# Patient Record
Sex: Female | Born: 1945 | Race: White | Hispanic: No | Smoking: Former smoker
Health system: Southern US, Community
[De-identification: ages and names within clinical notes are randomized; demographics above are authoritative.]

## PROBLEM LIST (undated history)

## (undated) DIAGNOSIS — H269 Unspecified cataract: Secondary | ICD-10-CM

## (undated) DIAGNOSIS — E039 Hypothyroidism, unspecified: Secondary | ICD-10-CM

## (undated) HISTORY — PX: BACK SURGERY: SHX140

## (undated) HISTORY — DX: Unspecified cataract: H26.9

## (undated) HISTORY — PX: ABDOMINAL HYSTERECTOMY: SHX81

## (undated) HISTORY — PX: EYE SURGERY: SHX253

---

## 2000-12-05 ENCOUNTER — Other Ambulatory Visit: Admission: RE | Admit: 2000-12-05 | Discharge: 2000-12-05 | Payer: Self-pay | Admitting: Family Medicine

## 2003-01-14 ENCOUNTER — Other Ambulatory Visit: Admission: RE | Admit: 2003-01-14 | Discharge: 2003-01-14 | Payer: Self-pay | Admitting: Family Medicine

## 2004-01-15 ENCOUNTER — Ambulatory Visit: Payer: Self-pay | Admitting: Family Medicine

## 2004-01-15 ENCOUNTER — Other Ambulatory Visit: Admission: RE | Admit: 2004-01-15 | Discharge: 2004-01-15 | Payer: Self-pay | Admitting: Family Medicine

## 2004-03-04 ENCOUNTER — Ambulatory Visit: Payer: Self-pay | Admitting: Family Medicine

## 2006-01-16 ENCOUNTER — Encounter (INDEPENDENT_AMBULATORY_CARE_PROVIDER_SITE_OTHER): Payer: Self-pay | Admitting: Specialist

## 2006-01-16 ENCOUNTER — Ambulatory Visit: Payer: Self-pay | Admitting: Family Medicine

## 2006-01-16 ENCOUNTER — Other Ambulatory Visit: Admission: RE | Admit: 2006-01-16 | Discharge: 2006-01-16 | Payer: Self-pay | Admitting: Family Medicine

## 2006-01-16 LAB — CONVERTED CEMR LAB
ALT: 20 units/L (ref 0–40)
Albumin: 3.8 g/dL (ref 3.5–5.2)
Alkaline Phosphatase: 114 units/L (ref 39–117)
Basophils Absolute: 0.1 10*3/uL (ref 0.0–0.1)
CO2: 29 meq/L (ref 19–32)
Chol/HDL Ratio, serum: 6.5
GFR calc non Af Amer: 68 mL/min
Glomerular Filtration Rate, Af Am: 82 mL/min/{1.73_m2}
Glucose, Bld: 97 mg/dL (ref 70–99)
LDL DIRECT: 168.4 mg/dL
Monocytes Relative: 5.4 % (ref 3.0–11.0)
Platelets: 290 10*3/uL (ref 150–400)
Potassium: 4.1 meq/L (ref 3.5–5.1)
RBC: 4.4 M/uL (ref 3.87–5.11)
RDW: 13 % (ref 11.5–14.6)
TSH: 5.21 microintl units/mL (ref 0.35–5.50)
Total Bilirubin: 0.8 mg/dL (ref 0.3–1.2)
Total Protein: 6.8 g/dL (ref 6.0–8.3)
Triglyceride fasting, serum: 145 mg/dL (ref 0–149)

## 2006-04-27 ENCOUNTER — Encounter (INDEPENDENT_AMBULATORY_CARE_PROVIDER_SITE_OTHER): Payer: Self-pay | Admitting: *Deleted

## 2006-04-27 ENCOUNTER — Ambulatory Visit: Payer: Self-pay | Admitting: Family Medicine

## 2006-04-27 ENCOUNTER — Encounter: Payer: Self-pay | Admitting: Family Medicine

## 2006-04-27 ENCOUNTER — Other Ambulatory Visit: Admission: RE | Admit: 2006-04-27 | Discharge: 2006-04-27 | Payer: Self-pay | Admitting: Family Medicine

## 2006-05-08 ENCOUNTER — Ambulatory Visit: Payer: Self-pay | Admitting: Family Medicine

## 2006-05-08 LAB — CONVERTED CEMR LAB
Albumin: 3.7 g/dL (ref 3.5–5.2)
BUN: 6 mg/dL (ref 6–23)
CO2: 30 meq/L (ref 19–32)
Creatinine, Ser: 0.7 mg/dL (ref 0.4–1.2)
GFR calc non Af Amer: 91 mL/min
Total Bilirubin: 0.8 mg/dL (ref 0.3–1.2)

## 2006-05-18 ENCOUNTER — Ambulatory Visit: Payer: Self-pay | Admitting: Gastroenterology

## 2006-05-24 ENCOUNTER — Ambulatory Visit: Payer: Self-pay | Admitting: Family Medicine

## 2006-05-24 LAB — CONVERTED CEMR LAB
Albumin: 4.4 g/dL (ref 3.5–5.2)
HCV Ab: NEGATIVE
Hep A IgM: NEGATIVE
Indirect Bilirubin: 0.7 mg/dL (ref 0.0–0.9)
Total Protein: 7.4 g/dL (ref 6.0–8.3)

## 2006-05-29 DIAGNOSIS — E785 Hyperlipidemia, unspecified: Secondary | ICD-10-CM | POA: Insufficient documentation

## 2006-05-29 DIAGNOSIS — Z9079 Acquired absence of other genital organ(s): Secondary | ICD-10-CM | POA: Insufficient documentation

## 2006-07-14 ENCOUNTER — Ambulatory Visit: Payer: Self-pay | Admitting: Family Medicine

## 2006-07-14 DIAGNOSIS — M25559 Pain in unspecified hip: Secondary | ICD-10-CM | POA: Insufficient documentation

## 2007-01-13 ENCOUNTER — Encounter: Payer: Self-pay | Admitting: Family Medicine

## 2007-01-29 ENCOUNTER — Encounter (INDEPENDENT_AMBULATORY_CARE_PROVIDER_SITE_OTHER): Payer: Self-pay | Admitting: *Deleted

## 2007-03-01 ENCOUNTER — Ambulatory Visit: Payer: Self-pay | Admitting: Family Medicine

## 2007-03-01 DIAGNOSIS — J019 Acute sinusitis, unspecified: Secondary | ICD-10-CM | POA: Insufficient documentation

## 2007-06-29 ENCOUNTER — Ambulatory Visit: Payer: Self-pay | Admitting: Family Medicine

## 2007-07-09 ENCOUNTER — Telehealth (INDEPENDENT_AMBULATORY_CARE_PROVIDER_SITE_OTHER): Payer: Self-pay | Admitting: *Deleted

## 2007-07-17 ENCOUNTER — Telehealth (INDEPENDENT_AMBULATORY_CARE_PROVIDER_SITE_OTHER): Payer: Self-pay | Admitting: *Deleted

## 2007-12-28 ENCOUNTER — Ambulatory Visit: Payer: Self-pay | Admitting: Gastroenterology

## 2008-01-11 ENCOUNTER — Ambulatory Visit: Payer: Self-pay | Admitting: Gastroenterology

## 2008-01-16 ENCOUNTER — Other Ambulatory Visit: Admission: RE | Admit: 2008-01-16 | Discharge: 2008-01-16 | Payer: Self-pay | Admitting: Internal Medicine

## 2014-06-05 ENCOUNTER — Encounter: Payer: Self-pay | Admitting: Gastroenterology

## 2017-05-23 ENCOUNTER — Other Ambulatory Visit: Payer: Self-pay | Admitting: Orthopedic Surgery

## 2017-05-23 DIAGNOSIS — M545 Low back pain, unspecified: Secondary | ICD-10-CM

## 2017-06-12 ENCOUNTER — Ambulatory Visit
Admission: RE | Admit: 2017-06-12 | Discharge: 2017-06-12 | Disposition: A | Discharge status: Home / Self Care | Payer: Medicare Other | Source: Ambulatory Visit | Attending: Orthopedic Surgery | Admitting: Orthopedic Surgery

## 2017-06-12 ENCOUNTER — Other Ambulatory Visit: Payer: Self-pay

## 2017-06-12 DIAGNOSIS — M545 Low back pain, unspecified: Secondary | ICD-10-CM

## 2018-01-15 ENCOUNTER — Encounter: Payer: Self-pay | Admitting: Gastroenterology

## 2018-01-29 ENCOUNTER — Other Ambulatory Visit (HOSPITAL_COMMUNITY): Payer: Self-pay | Admitting: Neurological Surgery

## 2018-01-29 ENCOUNTER — Other Ambulatory Visit: Payer: Self-pay | Admitting: Neurological Surgery

## 2018-01-29 DIAGNOSIS — M4316 Spondylolisthesis, lumbar region: Secondary | ICD-10-CM

## 2018-02-07 HISTORY — PX: BACK SURGERY: SHX140

## 2018-02-13 MED ORDER — SODIUM CHLORIDE 0.9 % IV SOLN: 4.0000 mg | Freq: Four times a day (QID) | INTRAVENOUS | Status: AC | PRN

## 2018-02-15 ENCOUNTER — Ambulatory Visit (HOSPITAL_COMMUNITY)
Admission: RE | Admit: 2018-02-15 | Discharge: 2018-02-15 | Disposition: A | Discharge status: Home / Self Care | Payer: Medicare Other | Source: Ambulatory Visit | Attending: Neurological Surgery | Admitting: Neurological Surgery

## 2018-02-15 ENCOUNTER — Other Ambulatory Visit: Payer: Self-pay

## 2018-02-15 DIAGNOSIS — M48062 Spinal stenosis, lumbar region with neurogenic claudication: Secondary | ICD-10-CM | POA: Insufficient documentation

## 2018-02-15 DIAGNOSIS — M4316 Spondylolisthesis, lumbar region: Secondary | ICD-10-CM

## 2018-02-15 DIAGNOSIS — M5116 Intervertebral disc disorders with radiculopathy, lumbar region: Secondary | ICD-10-CM | POA: Insufficient documentation

## 2018-02-15 MED ORDER — IOPAMIDOL (ISOVUE-M 200) INJECTION 41%
20.0000 mL | Freq: Once | INTRAMUSCULAR | Status: AC
  Administered 2018-02-15: 12 mL via INTRATHECAL

## 2018-02-15 MED ORDER — DIAZEPAM 5 MG PO TABS
10.0000 mg | ORAL_TABLET | Freq: Once | ORAL | Status: AC
  Administered 2018-02-15: 10 mg via ORAL

## 2018-02-15 MED ORDER — HYDROCODONE-ACETAMINOPHEN 5-325 MG PO TABS
ORAL_TABLET | ORAL | Status: AC
  Filled 2018-02-15: qty 2

## 2018-02-15 MED ORDER — LIDOCAINE HCL (PF) 1 % IJ SOLN
5.0000 mL | Freq: Once | INTRAMUSCULAR | Status: AC
  Administered 2018-02-15: 5 mL via INTRADERMAL

## 2018-02-15 MED ORDER — HYDROCODONE-ACETAMINOPHEN 5-325 MG PO TABS
1.0000 | ORAL_TABLET | ORAL | Status: DC | PRN
  Administered 2018-02-15: 2 via ORAL

## 2018-02-15 MED ORDER — ONDANSETRON HCL 4 MG/2ML IJ SOLN: 4.0000 mg | Freq: Four times a day (QID) | INTRAMUSCULAR | Status: DC | PRN

## 2018-02-15 NOTE — Procedures (Signed)
Kylie Holmes is a 73 year old individual who has had significant issues with back pain over the past years time.  In May 2019 she underwent an MRI which demonstrated degenerative changes in the discs at L4-5 and L5-S1 with some mild to moderate stenosis.  Just before Christmas she developed significant pain in the back in both legs such that she could not walk more than 6 or 7 steps without experiencing severe and excruciating pain.  Because of the dramatic change I advised another imaging study this time in the form of a myelogram and post myelogram CAT scan.  Pre op Dx: Lumbar radiculopathy acute, severe Post op Dx: Lumbar radiculopathy, acute, severe Procedure: Lumbar myelogram Surgeon: Meleny Tregoning Puncture level: L2-3 Fluid color: Clear colorless Injection: Isovue-200, 12 mL Findings: Myelographic block at midportion of the body of L4 with some accumulation of dye at the L5-S1 space.  Consistent with severe central stenosis not previously noted.  Further evaluation with CT scan

## 2018-02-15 NOTE — Discharge Instructions (Signed)
Myelogram, Care After °Refer to this sheet in the next few weeks. These instructions provide you with information about caring for yourself after your procedure. Your health care provider may also give you more specific instructions. Your treatment has been planned according to current medical practices, but problems sometimes occur. Call your health care provider if you have any problems or questions after your procedure. °What can I expect after the procedure? °After the procedure, it is common to have: °· Soreness at your injection site. °· A mild headache. °Follow these instructions at home: °· Drink enough fluid to keep your urine clear or pale yellow. This will help flush out the dye (contrast material) from your spine. °· Rest as told by your health care provider. Lie flat with your head slightly raised (elevated) to reduce the risk of headache. °· Do not bend, lift, or do any strenuous activity for 24-48 hours or as told by your health care provider. °· Take over-the-counter and prescription medicines only as told by your health care provider. °· Take care of and remove your bandage (dressing) as told by your health care provider. °· Bathe or shower as told by your health care provider. °Contact a health care provider if: °· You have a fever. °· You have a headache that lasts longer than 24 hours. °· You feel nauseous or vomit. °· You have a stiff neck or numbness in your legs. °· You are unable to urinate or have a bowel movement. °· You develop a rash, itching, or sneezing. °Get help right away if: °· You have new symptoms or your symptoms get worse. °· You have a seizure. °· You have trouble breathing. °This information is not intended to replace advice given to you by your health care provider. Make sure you discuss any questions you have with your health care provider. °Document Released: 02/20/2015 Document Revised: 07/02/2015 Document Reviewed: 11/06/2014 °Elsevier Interactive Patient Education © 2019  [employer]. ° °

## 2018-02-16 ENCOUNTER — Encounter: Payer: Medicare Other | Admitting: Gastroenterology

## 2018-02-27 ENCOUNTER — Other Ambulatory Visit: Payer: Self-pay | Admitting: Neurological Surgery

## 2018-03-09 NOTE — Pre-Procedure Instructions (Signed)
Kylie Holmes Herald  03/09/2018      [facility] [place], [place] [address] Phone: [phone] Fax: [phone]    Your procedure is scheduled on March 19, 2018.  Report to [facility] Admitting at 1200 PM.  Call this number if you have problems the morning of surgery:  [phone]   Remember:  Do not eat or drink after midnight.    Take these medicines the morning of surgery with A SIP OF WATER  Levothyroxine (synthroid) Methocarbamol (robaxin)-if needed for muscle spasms  7 days prior to surgery STOP taking any Aspirin (unless otherwise instructed by your surgeon), Aleve, Naproxen, Ibuprofen, Motrin, Advil, Goody's, BC's, all herbal medications, fish oil, and all vitamins     Do not wear jewelry, make-up or nail polish.  Do not wear lotions, powders, or perfumes, or deodorant.  Do not shave 48 hours prior to surgery.   Do not bring valuables to the hospital.  [facility] is not responsible for any belongings or valuables.  Contacts, dentures or bridgework may not be worn into surgery.  Leave your suitcase in the car.  After surgery it may be brought to your room.  For patients admitted to the hospital, discharge time will be determined by your treatment team.  Patients discharged the day of surgery will not be allowed to drive home.    [facility]- Preparing For Surgery  Before surgery, you can play an important role. Because skin is not sterile, your skin needs to be as free of germs as possible. You can reduce the number of germs on your skin by washing with CHG (chlorahexidine gluconate) Soap before surgery.  CHG is an antiseptic cleaner which kills germs and bonds with the skin to continue killing germs even after washing.    Oral Hygiene is also important to reduce your risk of infection.  Remember - BRUSH YOUR TEETH THE MORNING OF SURGERY WITH YOUR REGULAR TOOTHPASTE  Please do not use  if you have an allergy to CHG or antibacterial soaps. If your skin becomes reddened/irritated stop using the CHG.  Do not shave (including legs and underarms) for at least 48 hours prior to first CHG shower. It is OK to shave your face.  Please follow these instructions carefully.   1. Shower the NIGHT BEFORE SURGERY and the MORNING OF SURGERY with CHG.   2. If you chose to wash your hair, wash your hair first as usual with your normal shampoo.  3. After you shampoo, rinse your hair and body thoroughly to remove the shampoo.  4. Use CHG as you would any other liquid soap. You can apply CHG directly to the skin and wash gently with a scrungie or a clean washcloth.   5. Apply the CHG Soap to your body ONLY FROM THE NECK DOWN.  Do not use on open wounds or open sores. Avoid contact with your eyes, ears, mouth and genitals (private parts). Wash Face and genitals (private parts)  with your normal soap.  6. Wash thoroughly, paying special attention to the area where your surgery will be performed.  7. Thoroughly rinse your body with warm water from the neck down.  8. DO NOT shower/wash with your normal soap after using and rinsing off the CHG Soap.  9. Pat yourself dry with a CLEAN TOWEL.  10. Wear CLEAN PAJAMAS to bed the night before surgery, wear comfortable clothes the morning  of surgery  11. Place CLEAN SHEETS on your bed the night of your first shower and DO NOT SLEEP WITH PETS.  Day of Surgery:  Do not apply any deodorants/lotions.  Please wear clean clothes to the hospital/surgery center.   Remember to brush your teeth WITH YOUR REGULAR TOOTHPASTE.  Please read over the following fact sheets that you were given.

## 2018-03-09 NOTE — Progress Notes (Addendum)
PCP - Wenda Low, MD Cardiologist - n/a  Chest x-ray - pt denies past year EKG - pt denies past year  Stress Test - pt denies ECHO - pt denies   Cardiac Cath - pt denies  Sleep Study - pt denies CPAP - n/a  Fasting Blood Sugar - n/a Checks Blood Sugar _____ times a day-n/a  Blood Thinner Instructions: n/a Aspirin Instructions: n/a   Anesthesia review: pending  Patient denies shortness of breath, fever, cough and chest pain at PAT appointment  Patient verbalized understanding of instructions that were given to them at the PAT appointment. Patient was also instructed that they will need to review over the PAT instructions again at home before surgery.

## 2018-03-12 ENCOUNTER — Encounter (HOSPITAL_COMMUNITY): Payer: Self-pay

## 2018-03-12 ENCOUNTER — Other Ambulatory Visit: Payer: Self-pay

## 2018-03-12 ENCOUNTER — Encounter (HOSPITAL_COMMUNITY)
Admission: RE | Admit: 2018-03-12 | Discharge: 2018-03-12 | Disposition: A | Discharge status: Home / Self Care | Payer: Medicare Other | Source: Ambulatory Visit | Attending: Neurological Surgery | Admitting: Neurological Surgery

## 2018-03-12 DIAGNOSIS — M4316 Spondylolisthesis, lumbar region: Secondary | ICD-10-CM | POA: Diagnosis not present

## 2018-03-12 DIAGNOSIS — Z01818 Encounter for other preprocedural examination: Secondary | ICD-10-CM | POA: Insufficient documentation

## 2018-03-12 HISTORY — DX: Hypothyroidism, unspecified: E03.9

## 2018-03-12 LAB — CBC
HCT: 39.3 % (ref 36.0–46.0)
HEMOGLOBIN: 13.5 g/dL (ref 12.0–15.0)
MCH: 33.8 pg (ref 26.0–34.0)
MCHC: 34.4 g/dL (ref 30.0–36.0)
MCV: 98.3 fL (ref 80.0–100.0)
Platelets: 219 10*3/uL (ref 150–400)
RBC: 4 MIL/uL (ref 3.87–5.11)
RDW: 13 % (ref 11.5–15.5)
WBC: 7.1 10*3/uL (ref 4.0–10.5)
nRBC: 0 % (ref 0.0–0.2)

## 2018-03-12 LAB — TYPE AND SCREEN
ABO/RH(D): O POS
Antibody Screen: NEGATIVE

## 2018-03-12 LAB — BASIC METABOLIC PANEL
Anion gap: 9 (ref 5–15)
BUN: 11 mg/dL (ref 8–23)
CHLORIDE: 106 mmol/L (ref 98–111)
CO2: 26 mmol/L (ref 22–32)
CREATININE: 0.79 mg/dL (ref 0.44–1.00)
Calcium: 8.7 mg/dL — ABNORMAL LOW (ref 8.9–10.3)
GFR calc Af Amer: >60 mL/min (ref >60)
GFR calc non Af Amer: >60 mL/min (ref >60)
Glucose, Bld: 99 mg/dL (ref 70–99)
Potassium: 3.8 mmol/L (ref 3.5–5.1)
SODIUM: 141 mmol/L (ref 135–145)

## 2018-03-12 LAB — ABO/RH: ABO/RH(D): O POS

## 2018-03-12 LAB — SURGICAL PCR SCREEN
MRSA, PCR: NEGATIVE
Staphylococcus aureus: NEGATIVE

## 2018-03-19 ENCOUNTER — Other Ambulatory Visit: Payer: Self-pay

## 2018-03-19 ENCOUNTER — Inpatient Hospital Stay (HOSPITAL_COMMUNITY)
Admission: RE | Admit: 2018-03-19 | Discharge: 2018-03-21 | DRG: 455 | Disposition: A | Discharge status: Home Health | Payer: Medicare Other | Attending: Neurological Surgery | Admitting: Neurological Surgery

## 2018-03-19 ENCOUNTER — Inpatient Hospital Stay (HOSPITAL_COMMUNITY): Payer: Medicare Other

## 2018-03-19 ENCOUNTER — Inpatient Hospital Stay (HOSPITAL_COMMUNITY)
Admission: RE | Disposition: A | Discharge status: Home Health | Payer: Self-pay | Source: Home / Self Care | Attending: Neurological Surgery

## 2018-03-19 ENCOUNTER — Inpatient Hospital Stay (HOSPITAL_COMMUNITY): Payer: Medicare Other | Admitting: Certified Registered Nurse Anesthetist

## 2018-03-19 ENCOUNTER — Encounter (HOSPITAL_COMMUNITY): Payer: Self-pay

## 2018-03-19 DIAGNOSIS — I9581 Postprocedural hypotension: Secondary | ICD-10-CM | POA: Diagnosis not present

## 2018-03-19 DIAGNOSIS — Z87891 Personal history of nicotine dependence: Secondary | ICD-10-CM

## 2018-03-19 DIAGNOSIS — G992 Myelopathy in diseases classified elsewhere: Secondary | ICD-10-CM | POA: Diagnosis present

## 2018-03-19 DIAGNOSIS — M48062 Spinal stenosis, lumbar region with neurogenic claudication: Principal | ICD-10-CM | POA: Diagnosis present

## 2018-03-19 DIAGNOSIS — E039 Hypothyroidism, unspecified: Secondary | ICD-10-CM | POA: Diagnosis present

## 2018-03-19 DIAGNOSIS — M4316 Spondylolisthesis, lumbar region: Secondary | ICD-10-CM | POA: Diagnosis present

## 2018-03-19 DIAGNOSIS — Z419 Encounter for procedure for purposes other than remedying health state, unspecified: Secondary | ICD-10-CM

## 2018-03-19 DIAGNOSIS — Z79899 Other long term (current) drug therapy: Secondary | ICD-10-CM

## 2018-03-19 DIAGNOSIS — M5116 Intervertebral disc disorders with radiculopathy, lumbar region: Secondary | ICD-10-CM | POA: Diagnosis present

## 2018-03-19 DIAGNOSIS — Z79890 Hormone replacement therapy (postmenopausal): Secondary | ICD-10-CM

## 2018-03-19 DIAGNOSIS — Z90710 Acquired absence of both cervix and uterus: Secondary | ICD-10-CM

## 2018-03-19 LAB — HEMOGLOBIN AND HEMATOCRIT, BLOOD
HCT: 31.8 % — ABNORMAL LOW (ref 36.0–46.0)
Hemoglobin: 10.6 g/dL — ABNORMAL LOW (ref 12.0–15.0)

## 2018-03-19 SURGERY — POSTERIOR LUMBAR FUSION 1 LEVEL
Anesthesia: General | Site: Spine Lumbar

## 2018-03-19 MED ORDER — OXYCODONE-ACETAMINOPHEN 5-325 MG PO TABS
1.0000 | ORAL_TABLET | ORAL | Status: DC | PRN
  Filled 2018-03-19: qty 2

## 2018-03-19 MED ORDER — LACTATED RINGERS IV SOLN
INTRAVENOUS | Status: DC
  Administered 2018-03-19: 20:00:00 via INTRAVENOUS

## 2018-03-19 MED ORDER — LACTATED RINGERS IV SOLN
INTRAVENOUS | Status: DC
  Administered 2018-03-19 (×3): via INTRAVENOUS

## 2018-03-19 MED ORDER — ONDANSETRON HCL 4 MG PO TABS: 4.0000 mg | ORAL_TABLET | Freq: Four times a day (QID) | ORAL | Status: DC | PRN

## 2018-03-19 MED ORDER — ONDANSETRON HCL 4 MG/2ML IJ SOLN
INTRAMUSCULAR | Status: AC
  Filled 2018-03-19: qty 2

## 2018-03-19 MED ORDER — SODIUM CHLORIDE 0.9% FLUSH
3.0000 mL | Freq: Two times a day (BID) | INTRAVENOUS | Status: DC
  Administered 2018-03-20 (×2): 3 mL via INTRAVENOUS

## 2018-03-19 MED ORDER — ACETAMINOPHEN 325 MG PO TABS
650.0000 mg | ORAL_TABLET | ORAL | Status: DC | PRN
  Administered 2018-03-20 – 2018-03-21 (×3): 650 mg via ORAL
  Filled 2018-03-19 (×3): qty 2

## 2018-03-19 MED ORDER — METHOCARBAMOL 500 MG PO TABS
500.0000 mg | ORAL_TABLET | Freq: Four times a day (QID) | ORAL | Status: DC | PRN
  Administered 2018-03-20 (×2): 500 mg via ORAL
  Filled 2018-03-19 (×2): qty 1

## 2018-03-19 MED ORDER — THROMBIN 20000 UNITS EX SOLR
CUTANEOUS | Status: DC | PRN
  Administered 2018-03-19: 20 mL via TOPICAL

## 2018-03-19 MED ORDER — ALUM & MAG HYDROXIDE-SIMETH 200-200-20 MG/5ML PO SUSP: 30.0000 mL | Freq: Four times a day (QID) | ORAL | Status: DC | PRN

## 2018-03-19 MED ORDER — CEFAZOLIN SODIUM-DEXTROSE 2-4 GM/100ML-% IV SOLN
2.0000 g | INTRAVENOUS | Status: AC
  Administered 2018-03-19 (×2): 2 g via INTRAVENOUS
  Filled 2018-03-19: qty 100

## 2018-03-19 MED ORDER — BUPIVACAINE HCL (PF) 0.5 % IJ SOLN
INTRAMUSCULAR | Status: DC | PRN
  Administered 2018-03-19: 5 mL

## 2018-03-19 MED ORDER — ONDANSETRON HCL 4 MG/2ML IJ SOLN
4.0000 mg | Freq: Once | INTRAMUSCULAR | Status: AC | PRN
  Administered 2018-03-19: 4 mg via INTRAVENOUS

## 2018-03-19 MED ORDER — FENTANYL CITRATE (PF) 100 MCG/2ML IJ SOLN
INTRAMUSCULAR | Status: AC
  Administered 2018-03-19: 50 ug via INTRAVENOUS
  Filled 2018-03-19: qty 2

## 2018-03-19 MED ORDER — ONDANSETRON HCL 4 MG/2ML IJ SOLN: 4.0000 mg | Freq: Four times a day (QID) | INTRAMUSCULAR | Status: DC | PRN

## 2018-03-19 MED ORDER — LIDOCAINE 2% (20 MG/ML) 5 ML SYRINGE
INTRAMUSCULAR | Status: DC | PRN
  Administered 2018-03-19 (×2): 50 mg via INTRAVENOUS

## 2018-03-19 MED ORDER — PROPOFOL 10 MG/ML IV BOLUS
INTRAVENOUS | Status: DC | PRN
  Administered 2018-03-19 (×2): 20 mg via INTRAVENOUS
  Administered 2018-03-19: 160 mg via INTRAVENOUS

## 2018-03-19 MED ORDER — OXYCODONE HCL 5 MG PO TABS: 5.0000 mg | ORAL_TABLET | Freq: Once | ORAL | Status: DC | PRN

## 2018-03-19 MED ORDER — OXYCODONE HCL 5 MG/5ML PO SOLN: 5.0000 mg | Freq: Once | ORAL | Status: DC | PRN

## 2018-03-19 MED ORDER — LIDOCAINE-EPINEPHRINE 1 %-1:100000 IJ SOLN
INTRAMUSCULAR | Status: DC | PRN
  Administered 2018-03-19: 5 mL

## 2018-03-19 MED ORDER — METHOCARBAMOL 500 MG PO TABS: 500.0000 mg | ORAL_TABLET | Freq: Three times a day (TID) | ORAL | Status: DC | PRN

## 2018-03-19 MED ORDER — POLYETHYLENE GLYCOL 3350 17 G PO PACK: 17.0000 g | PACK | Freq: Every day | ORAL | Status: DC | PRN

## 2018-03-19 MED ORDER — LIDOCAINE-EPINEPHRINE 1 %-1:100000 IJ SOLN
INTRAMUSCULAR | Status: AC
  Filled 2018-03-19: qty 1

## 2018-03-19 MED ORDER — CHLORHEXIDINE GLUCONATE CLOTH 2 % EX PADS: 6.0000 | MEDICATED_PAD | Freq: Once | CUTANEOUS | Status: DC

## 2018-03-19 MED ORDER — DEXAMETHASONE SODIUM PHOSPHATE 10 MG/ML IJ SOLN
INTRAMUSCULAR | Status: AC
  Filled 2018-03-19: qty 1

## 2018-03-19 MED ORDER — PHENOL 1.4 % MT LIQD: 1.0000 | OROMUCOSAL | Status: DC | PRN

## 2018-03-19 MED ORDER — MORPHINE SULFATE (PF) 2 MG/ML IV SOLN
2.0000 mg | INTRAVENOUS | Status: DC | PRN
  Filled 2018-03-19: qty 1

## 2018-03-19 MED ORDER — SENNA 8.6 MG PO TABS
1.0000 | ORAL_TABLET | Freq: Two times a day (BID) | ORAL | Status: DC
  Administered 2018-03-20: 8.6 mg via ORAL
  Filled 2018-03-19 (×2): qty 1

## 2018-03-19 MED ORDER — FENTANYL CITRATE (PF) 100 MCG/2ML IJ SOLN
INTRAMUSCULAR | Status: DC | PRN
  Administered 2018-03-19: 50 ug via INTRAVENOUS
  Administered 2018-03-19: 25 ug via INTRAVENOUS
  Administered 2018-03-19: 50 ug via INTRAVENOUS

## 2018-03-19 MED ORDER — ALBUMIN HUMAN 5 % IV SOLN
INTRAVENOUS | Status: AC
  Filled 2018-03-19: qty 500

## 2018-03-19 MED ORDER — SODIUM CHLORIDE 0.9 % IV SOLN: 250.0000 mL | INTRAVENOUS | Status: DC

## 2018-03-19 MED ORDER — LIDOCAINE 2% (20 MG/ML) 5 ML SYRINGE
INTRAMUSCULAR | Status: AC
  Filled 2018-03-19: qty 5

## 2018-03-19 MED ORDER — THROMBIN 5000 UNITS EX SOLR
CUTANEOUS | Status: AC
  Filled 2018-03-19: qty 5000

## 2018-03-19 MED ORDER — FENTANYL CITRATE (PF) 250 MCG/5ML IJ SOLN
INTRAMUSCULAR | Status: AC
  Filled 2018-03-19: qty 5

## 2018-03-19 MED ORDER — DOCUSATE SODIUM 100 MG PO CAPS
100.0000 mg | ORAL_CAPSULE | Freq: Two times a day (BID) | ORAL | Status: DC
  Administered 2018-03-20 (×2): 100 mg via ORAL
  Filled 2018-03-19 (×2): qty 1

## 2018-03-19 MED ORDER — THROMBIN 20000 UNITS EX SOLR
CUTANEOUS | Status: AC
  Filled 2018-03-19: qty 20000

## 2018-03-19 MED ORDER — GLYCOPYRROLATE PF 0.2 MG/ML IJ SOSY
PREFILLED_SYRINGE | INTRAMUSCULAR | Status: DC | PRN
  Administered 2018-03-19: .2 mg via INTRAVENOUS

## 2018-03-19 MED ORDER — BUPIVACAINE HCL (PF) 0.5 % IJ SOLN
INTRAMUSCULAR | Status: AC
  Filled 2018-03-19: qty 30

## 2018-03-19 MED ORDER — SODIUM CHLORIDE 0.9% FLUSH: 3.0000 mL | INTRAVENOUS | Status: DC | PRN

## 2018-03-19 MED ORDER — METHOCARBAMOL 1000 MG/10ML IJ SOLN
500.0000 mg | Freq: Four times a day (QID) | INTRAVENOUS | Status: DC | PRN
  Administered 2018-03-19 – 2018-03-20 (×2): 500 mg via INTRAVENOUS
  Filled 2018-03-19: qty 500
  Filled 2018-03-19: qty 5
  Filled 2018-03-19: qty 500

## 2018-03-19 MED ORDER — FENTANYL CITRATE (PF) 100 MCG/2ML IJ SOLN
25.0000 ug | INTRAMUSCULAR | Status: DC | PRN
  Administered 2018-03-19 (×2): 25 ug via INTRAVENOUS
  Administered 2018-03-19 (×2): 50 ug via INTRAVENOUS

## 2018-03-19 MED ORDER — BISACODYL 10 MG RE SUPP: 10.0000 mg | Freq: Every day | RECTAL | Status: DC | PRN

## 2018-03-19 MED ORDER — ROCURONIUM BROMIDE 50 MG/5ML IV SOSY
PREFILLED_SYRINGE | INTRAVENOUS | Status: AC
  Filled 2018-03-19: qty 5

## 2018-03-19 MED ORDER — ONDANSETRON HCL 4 MG/2ML IJ SOLN
INTRAMUSCULAR | Status: DC | PRN
  Administered 2018-03-19: 4 mg via INTRAVENOUS

## 2018-03-19 MED ORDER — ACETAMINOPHEN 650 MG RE SUPP: 650.0000 mg | RECTAL | Status: DC | PRN

## 2018-03-19 MED ORDER — PROPOFOL 10 MG/ML IV BOLUS
INTRAVENOUS | Status: AC
  Filled 2018-03-19: qty 20

## 2018-03-19 MED ORDER — KETOROLAC TROMETHAMINE 15 MG/ML IJ SOLN
7.5000 mg | Freq: Four times a day (QID) | INTRAMUSCULAR | Status: AC
  Administered 2018-03-19 – 2018-03-20 (×4): 7.5 mg via INTRAVENOUS
  Filled 2018-03-19 (×5): qty 1

## 2018-03-19 MED ORDER — FENTANYL CITRATE (PF) 100 MCG/2ML IJ SOLN
INTRAMUSCULAR | Status: AC
  Administered 2018-03-19: 25 ug via INTRAVENOUS
  Filled 2018-03-19: qty 2

## 2018-03-19 MED ORDER — DEXAMETHASONE SODIUM PHOSPHATE 10 MG/ML IJ SOLN
INTRAMUSCULAR | Status: DC | PRN
  Administered 2018-03-19: 10 mg via INTRAVENOUS

## 2018-03-19 MED ORDER — SODIUM CHLORIDE 0.9 % IV SOLN
INTRAVENOUS | Status: DC | PRN
  Administered 2018-03-19: 500 mL

## 2018-03-19 MED ORDER — FLEET ENEMA 7-19 GM/118ML RE ENEM: 1.0000 | ENEMA | Freq: Once | RECTAL | Status: DC | PRN

## 2018-03-19 MED ORDER — CEFAZOLIN SODIUM-DEXTROSE 2-4 GM/100ML-% IV SOLN
2.0000 g | Freq: Three times a day (TID) | INTRAVENOUS | Status: AC
  Administered 2018-03-19 – 2018-03-20 (×2): 2 g via INTRAVENOUS
  Filled 2018-03-19 (×2): qty 100

## 2018-03-19 MED ORDER — 0.9 % SODIUM CHLORIDE (POUR BTL) OPTIME
TOPICAL | Status: DC | PRN
  Administered 2018-03-19: 1000 mL

## 2018-03-19 MED ORDER — SODIUM CHLORIDE 0.9 % IV SOLN
INTRAVENOUS | Status: DC | PRN
  Administered 2018-03-19: 20 ug/min via INTRAVENOUS

## 2018-03-19 MED ORDER — THROMBIN 5000 UNITS EX SOLR
OROMUCOSAL | Status: DC | PRN
  Administered 2018-03-19 (×2): 5 mL via TOPICAL

## 2018-03-19 MED ORDER — LEVOTHYROXINE SODIUM 75 MCG PO TABS
75.0000 ug | ORAL_TABLET | Freq: Every day | ORAL | Status: DC
  Administered 2018-03-20 – 2018-03-21 (×2): 75 ug via ORAL
  Filled 2018-03-19 (×2): qty 1

## 2018-03-19 MED ORDER — DEXMEDETOMIDINE HCL 200 MCG/2ML IV SOLN
INTRAVENOUS | Status: DC | PRN
  Administered 2018-03-19 (×2): 8 ug via INTRAVENOUS
  Administered 2018-03-19: 12 ug via INTRAVENOUS

## 2018-03-19 MED ORDER — ALBUMIN HUMAN 5 % IV SOLN
25.0000 g | Freq: Once | INTRAVENOUS | Status: AC
  Administered 2018-03-19: 25 g via INTRAVENOUS

## 2018-03-19 MED ORDER — ROCURONIUM BROMIDE 10 MG/ML (PF) SYRINGE
PREFILLED_SYRINGE | INTRAVENOUS | Status: DC | PRN
  Administered 2018-03-19: 20 mg via INTRAVENOUS
  Administered 2018-03-19: 30 mg via INTRAVENOUS
  Administered 2018-03-19: 50 mg via INTRAVENOUS
  Administered 2018-03-19 (×3): 10 mg via INTRAVENOUS

## 2018-03-19 MED ORDER — MENTHOL 3 MG MT LOZG: 1.0000 | LOZENGE | OROMUCOSAL | Status: DC | PRN

## 2018-03-19 SURGICAL SUPPLY — 72 items
BAG DECANTER FOR FLEXI CONT (MISCELLANEOUS) ×2 IMPLANT
BASKET BONE COLLECTION (BASKET) ×2 IMPLANT
BLADE CLIPPER SURG (BLADE) IMPLANT
BONE CANC CHIPS 20CC PCAN1/4 (Bone Implant) ×2 IMPLANT
BUR MATCHSTICK NEURO 3.0 LAGG (BURR) ×2 IMPLANT
CAGE COROENT LG 10X9X23-12 (Cage) ×4 IMPLANT
CANISTER SUCT 3000ML PPV (MISCELLANEOUS) ×2 IMPLANT
CHIPS CANC BONE 20CC PCAN1/4 (Bone Implant) ×1 IMPLANT
CONT SPEC 4OZ CLIKSEAL STRL BL (MISCELLANEOUS) ×2 IMPLANT
COVER BACK TABLE 60X90IN (DRAPES) ×2 IMPLANT
COVER WAND RF STERILE (DRAPES) IMPLANT
DECANTER SPIKE VIAL GLASS SM (MISCELLANEOUS) ×2 IMPLANT
DERMABOND ADVANCED (GAUZE/BANDAGES/DRESSINGS) ×1
DERMABOND ADVANCED .7 DNX12 (GAUZE/BANDAGES/DRESSINGS) ×1 IMPLANT
DEVICE DISSECT PLASMABLAD 3.0S (MISCELLANEOUS) ×1 IMPLANT
DRAPE C-ARM 42X72 X-RAY (DRAPES) ×4 IMPLANT
DRAPE HALF SHEET 40X57 (DRAPES) IMPLANT
DRAPE LAPAROTOMY 100X72X124 (DRAPES) ×2 IMPLANT
DRAPE MICROSCOPE LEICA (MISCELLANEOUS) ×2 IMPLANT
DRSG OPSITE POSTOP 4X6 (GAUZE/BANDAGES/DRESSINGS) IMPLANT
DURAPREP 26ML APPLICATOR (WOUND CARE) ×2 IMPLANT
DURASEAL APPLICATOR TIP (TIP) IMPLANT
DURASEAL SPINE SEALANT 3ML (MISCELLANEOUS) IMPLANT
ELECT REM PT RETURN 9FT ADLT (ELECTROSURGICAL) ×2
ELECTRODE REM PT RTRN 9FT ADLT (ELECTROSURGICAL) ×1 IMPLANT
GAUZE 4X4 16PLY RFD (DISPOSABLE) IMPLANT
GAUZE SPONGE 4X4 12PLY STRL (GAUZE/BANDAGES/DRESSINGS) IMPLANT
GLOVE BIO SURGEON STRL SZ7 (GLOVE) ×2 IMPLANT
GLOVE BIO SURGEON STRL SZ7.5 (GLOVE) ×2 IMPLANT
GLOVE BIOGEL PI IND STRL 6.5 (GLOVE) ×1 IMPLANT
GLOVE BIOGEL PI IND STRL 7.5 (GLOVE) ×1 IMPLANT
GLOVE BIOGEL PI IND STRL 8.5 (GLOVE) ×2 IMPLANT
GLOVE BIOGEL PI INDICATOR 6.5 (GLOVE) ×1
GLOVE BIOGEL PI INDICATOR 7.5 (GLOVE) ×1
GLOVE BIOGEL PI INDICATOR 8.5 (GLOVE) ×2
GLOVE ECLIPSE 8.5 STRL (GLOVE) ×4 IMPLANT
GLOVE SURG SS PI 6.0 STRL IVOR (GLOVE) ×4 IMPLANT
GLOVE SURG SS PI 7.5 STRL IVOR (GLOVE) ×14 IMPLANT
GOWN STRL REUS W/ TWL LRG LVL3 (GOWN DISPOSABLE) ×4 IMPLANT
GOWN STRL REUS W/ TWL XL LVL3 (GOWN DISPOSABLE) IMPLANT
GOWN STRL REUS W/TWL 2XL LVL3 (GOWN DISPOSABLE) ×4 IMPLANT
GOWN STRL REUS W/TWL LRG LVL3 (GOWN DISPOSABLE) ×4
GOWN STRL REUS W/TWL XL LVL3 (GOWN DISPOSABLE)
HEMOSTAT POWDER KIT SURGIFOAM (HEMOSTASIS) ×2 IMPLANT
KIT BASIN OR (CUSTOM PROCEDURE TRAY) ×2 IMPLANT
KIT INFUSE SMALL (Orthopedic Implant) ×2 IMPLANT
KIT TURNOVER KIT B (KITS) ×2 IMPLANT
MILL MEDIUM DISP (BLADE) ×2 IMPLANT
NEEDLE HYPO 22GX1.5 SAFETY (NEEDLE) ×2 IMPLANT
NEEDLE SPNL 20GX3.5 QUINCKE YW (NEEDLE) ×2 IMPLANT
NS IRRIG 1000ML POUR BTL (IV SOLUTION) ×2 IMPLANT
PACK LAMINECTOMY NEURO (CUSTOM PROCEDURE TRAY) ×2 IMPLANT
PAD ARMBOARD 7.5X6 YLW CONV (MISCELLANEOUS) ×10 IMPLANT
PATTIES SURGICAL .5 X1 (DISPOSABLE) ×2 IMPLANT
PLASMABLADE 3.0S (MISCELLANEOUS) ×2
ROD RELINE-O LORD 5.5X30MM (Rod) ×2 IMPLANT
ROD RELINE-O LORD 5.5X35MM (Rod) ×2 IMPLANT
RUBBERBAND STERILE (MISCELLANEOUS) ×4 IMPLANT
SCREW LOCK RELINE 5.5 TULIP (Screw) ×8 IMPLANT
SCREW RELINE-O POLY 6.5X45 (Screw) ×8 IMPLANT
SPONGE LAP 4X18 RFD (DISPOSABLE) IMPLANT
SPONGE SURGIFOAM ABS GEL 100 (HEMOSTASIS) ×2 IMPLANT
SUT PROLENE 6 0 BV (SUTURE) IMPLANT
SUT VIC AB 1 CT1 18XBRD ANBCTR (SUTURE) ×1 IMPLANT
SUT VIC AB 1 CT1 8-18 (SUTURE) ×1
SUT VIC AB 2-0 CP2 18 (SUTURE) ×2 IMPLANT
SUT VIC AB 3-0 SH 8-18 (SUTURE) ×2 IMPLANT
SYR 3ML LL SCALE MARK (SYRINGE) ×8 IMPLANT
TOWEL GREEN STERILE (TOWEL DISPOSABLE) ×2 IMPLANT
TOWEL GREEN STERILE FF (TOWEL DISPOSABLE) ×2 IMPLANT
TRAY FOLEY MTR SLVR 16FR STAT (SET/KITS/TRAYS/PACK) ×2 IMPLANT
WATER STERILE IRR 1000ML POUR (IV SOLUTION) ×2 IMPLANT

## 2018-03-19 NOTE — Progress Notes (Signed)
Patient ID: Kylie Holmes, female   DOB: [birth year], 73 y.o.   MRN: [id number] Patient is a little hypotensive immediately postop responded well to fluids and albumin alert moving all extremities reasonably comfortable at this time stable postop

## 2018-03-19 NOTE — Progress Notes (Signed)
Patient A&O x 4, incision is clean, dry and intact.  Patient states pain is 6 out of 10.  Patient complaining of mild nausea, Zofran was just given in PACU and not due at this time.

## 2018-03-19 NOTE — Anesthesia Procedure Notes (Signed)
Procedure Name: Intubation Performed by: Milford Cage, CRNA Pre-anesthesia Checklist: Patient identified, Emergency Drugs available, Suction available and Patient being monitored Patient Re-evaluated:Patient Re-evaluated prior to induction Oxygen Delivery Method: Circle System Utilized Preoxygenation: Pre-oxygenation with 100% oxygen Induction Type: IV induction Ventilation: Mask ventilation without difficulty Laryngoscope Size: 3 and Mac Grade View: Grade II Tube type: Oral Tube size: 7.0 mm Number of attempts: 1 Airway Equipment and Method: Stylet and Oral airway Placement Confirmation: ETT inserted through vocal cords under direct vision,  positive ETCO2 and breath sounds checked- equal and bilateral Secured at: 22 cm Tube secured with: Tape Dental Injury: Teeth and Oropharynx as per pre-operative assessment

## 2018-03-19 NOTE — Anesthesia Postprocedure Evaluation (Signed)
Anesthesia Post Note  Patient: Kylie Holmes  Procedure(s) Performed: Lumbar Four-FivePosterior lumbar interbody fusion (N/A Spine Lumbar)     Patient location during evaluation: PACU Anesthesia Type: General Level of consciousness: awake and alert Pain management: pain level controlled Vital Signs Assessment: post-procedure vital signs reviewed and stable Respiratory status: spontaneous breathing, nonlabored ventilation, respiratory function stable and patient connected to nasal cannula oxygen Cardiovascular status: blood pressure returned to baseline and stable Postop Assessment: no apparent nausea or vomiting Anesthetic complications: no    Last Vitals:  Vitals:   03/19/18 1600 03/19/18 1615  BP: (!) 106/57 111/60  Pulse: (!) 56 (!) 55  Resp: 14 15  Temp:    SpO2: 98% 94%    Last Pain:  Vitals:   03/19/18 1615  PainSc: 6     LLE Motor Response: Purposeful movement;Responds to commands (03/19/18 1615) LLE Sensation: Full sensation;No numbness;No pain;No tingling (03/19/18 1615) RLE Motor Response: Purposeful movement;Responds to commands (03/19/18 1615) RLE Sensation: Full sensation;No numbness;No pain;No tingling (03/19/18 1615)      Seanpaul Preece COKER

## 2018-03-19 NOTE — Op Note (Signed)
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  Pedicle fixation L4-L5. Surgeon: Kristeen Miss First Assistant: Deatra Ina, MD Anesthesia: General endotracheal Indications: Kylie Holmes is a 73 year old individual whose had significant back and bilateral leg pain with weakness a myelogram was performed recently which demonstrated a high-grade block at the L4-L5 level secondary to a large extruded fragment of disc centrally at L4-L5.  The disc appeared to extend its way up behind the body of L4.  She was advised regarding the need for surgery.  Procedure: The patient was brought to the operating room supine on the stretcher after the smooth induction of general endotracheal anesthesia she was carefully turned prone.  The back was prepped with alcohol DuraPrep and draped in a sterile fashion.  Midline incision was created and carried down to the lumbodorsal fascia which was opened on either side of midline.  Self-retaining McCullough retractor was placed in the wound and then after the laminar arch of L4 was identified and isolated this was removed initially sparing the spinous process and laminar arch and removing the inferior margin lamina of L2 and including the entirety of the facet at L4-L5.  Common dural tube was decompressed and on the right side there was noted to be a thickened path for the L4 nerve root this was gradually mobilized and medial to this was identified some subligamentous disc material the ligamentous space was opened and the disc material was removed contralateral laminotomy was performed and here a similar process was found however there was expected to  be much more disc material more medially and superiorly behind the body of L4 as was noted on the post myelogram CAT scan.  The discectomies were then completed removing all the endplate material at L4 and L5 and carefully decorticating so as to allow good surface for grafting.  Because there was felt to be more disc material more superiorly the entire laminar arch and spinous process of L4 was removed to allow easier exploration decompression this was done with the use of the operating microscope and several other fragments of disc were removed from the subligamentous space more superiorly.  In the end the common dural tube and the L4 nerve roots and the L5 nerve roots inferiorly were felt to be well decompressed a 10 mm tall 12 degree lordotic 25 mm long cage was then packed with autograft and allograft and placed into the interspace along with 9 cc of autograft allograft and infuse mixture.  Lateral gutters were decorticated and they were packed with an additional 6 cc each of the same mixture.  Pedicle entry sites were then chosen at L4 and L5 and 6.5 x 45 mm screws were placed into each of the pedicles under fluoroscopic guidance.  On the right side a 35 mm precontoured rod was used on the left side a 30 mm precontoured rod was used to connect of the construct in a neutral fashion.  Once this was completed hemostasis was carefully checked final radiographs were obtained in AP and lateral projection which showed good overall alignment and then the retractors were removed and the lumbodorsal fascia was closed with #1 Vicryl in interrupted fashion 2-0 Vicryl was used in subcutaneous tissues and 3-0 Vicryl subcuticularly.  25 cc of half percent Marcaine was injected into  the paraspinous fascia and the subcutaneous tissues.  Blood loss was estimated 300 cc patient was returned to recovery room in stable condition.

## 2018-03-19 NOTE — H&P (Addendum)
Kylie Holmes is an 73 y.o. female.   Chief Complaint: Back and leg pain HPI: Ms. Kylie Holmes is a 73 year old individual whose had significant history of back pain for about a year and a half.  She has tried all manner of conservative treatment and in the past 3 months the pain is gotten substantially worse.  A recent myelogram demonstrates presence of a large extruded fragment of L4-5 in addition to a degenerative spondylolisthesis at that level she is been advised that ultimately surgical intervention would require decompression and fusion of the joint and she is now admitted for that procedure.  Past Medical History:  Diagnosis Date  . Hypothyroidism     Past Surgical History:  Procedure Laterality Date  . ABDOMINAL HYSTERECTOMY    . BACK SURGERY     ACDF-30+year ago  . EYE SURGERY Bilateral    2018    History reviewed. No pertinent family history. Social History:  reports that she quit smoking about 6 years ago. She has never used smokeless tobacco. She reports previous alcohol use. She reports that she does not use drugs.  Allergies: No Known Allergies  Medications Prior to Admission  Medication Sig Dispense Refill  . levothyroxine (SYNTHROID, LEVOTHROID) 75 MCG tablet Take 75 mcg by mouth daily before breakfast.    . methocarbamol (ROBAXIN) 500 MG tablet Take 500 mg by mouth every 8 (eight) hours as needed for muscle spasms.    . Multiple Vitamins-Minerals (HAIR SKIN AND NAILS FORMULA PO) Take 1 tablet by mouth every other day.    . naproxen sodium (ALEVE) 220 MG tablet Take 440 mg by mouth daily as needed (pain).      No results found for this or any previous visit (from the past 48 hour(s)). No results found.  Review of Systems  Constitutional: Negative.   HENT: Negative.   Eyes: Negative.   Respiratory: Negative.   Cardiovascular: Negative.   Gastrointestinal: Negative.   Genitourinary: Negative.   Musculoskeletal: Positive for back pain.  Skin: Negative.    Neurological:       Leg pain weakness in tibialis anterior.  Endo/Heme/Allergies: Negative.   Psychiatric/Behavioral: Negative.     Blood pressure (!) 154/63, pulse 60, temperature 98.3 F (36.8 C), resp. rate 18, height 5\' 3"  (1.6 m), weight 78.2 kg, SpO2 100 %. Physical Exam  Constitutional: She is oriented to person, place, and time. She appears well-developed and well-nourished.  Eyes: Pupils are equal, round, and reactive to light. Conjunctivae and EOM are normal.  Neck: Normal range of motion. Neck supple.  Cardiovascular: Normal rate.  Respiratory: Effort normal and breath sounds normal.  GI: Soft. Bowel sounds are normal.  Musculoskeletal:     Comments: Positive straight leg raising at 15 degrees in either lower extremity.  Patrick's maneuver is negative bilaterally.  Neurological: She is alert and oriented to person, place, and time. She has normal reflexes.  Mild weakness in the tibialis anterior on the right.  Gastroc strength is symmetric bilaterally proximal leg strength is intact.  Tone and bulk are intact.  Station and gait are intact.  Skin: Skin is warm and dry.  Psychiatric: She has a normal mood and affect. Her behavior is normal. Judgment and thought content normal.     Assessment/Plan Herniated nucleus pulposus and spondylolisthesis L4-L5.  Plan: Decompression and fusion L4-L5  Earleen Newport, MD 03/19/2018, 9:31 AM

## 2018-03-19 NOTE — Transfer of Care (Signed)
Immediate Anesthesia Transfer of Care Note  Patient: Kylie Holmes  Procedure(s) Performed: Lumbar Four-FivePosterior lumbar interbody fusion (N/A Spine Lumbar)  Patient Location: PACU  Anesthesia Type:General  Level of Consciousness: drowsy  Airway & Oxygen Therapy: Patient Spontanous Breathing and Patient connected to face mask oxygen  Post-op Assessment: Report given to RN and Post -op Vital signs reviewed and stable  Post vital signs: Reviewed and stable  Last Vitals:  Vitals Value Taken Time  BP 87/50 03/19/2018  2:16 PM  Temp    Pulse 67 03/19/2018  2:16 PM  Resp 20 03/19/2018  2:16 PM  SpO2 100 % 03/19/2018  2:16 PM  Vitals shown include unvalidated device data.  Last Pain: There were no vitals filed for this visit.       Complications: No apparent anesthesia complications

## 2018-03-19 NOTE — Anesthesia Preprocedure Evaluation (Signed)

## 2018-03-20 LAB — BASIC METABOLIC PANEL
Anion gap: 10 (ref 5–15)
BUN: 8 mg/dL (ref 8–23)
CHLORIDE: 104 mmol/L (ref 98–111)
CO2: 23 mmol/L (ref 22–32)
Calcium: 8.4 mg/dL — ABNORMAL LOW (ref 8.9–10.3)
Creatinine, Ser: 0.79 mg/dL (ref 0.44–1.00)
GFR calc Af Amer: >60 mL/min (ref >60)
GFR calc non Af Amer: >60 mL/min (ref >60)
Glucose, Bld: 133 mg/dL — ABNORMAL HIGH (ref 70–99)
Potassium: 4 mmol/L (ref 3.5–5.1)
Sodium: 137 mmol/L (ref 135–145)

## 2018-03-20 LAB — CBC
HCT: 27.2 % — ABNORMAL LOW (ref 36.0–46.0)
Hemoglobin: 9.3 g/dL — ABNORMAL LOW (ref 12.0–15.0)
MCH: 32.7 pg (ref 26.0–34.0)
MCHC: 34.2 g/dL (ref 30.0–36.0)
MCV: 95.8 fL (ref 80.0–100.0)
Platelets: 185 10*3/uL (ref 150–400)
RBC: 2.84 MIL/uL — ABNORMAL LOW (ref 3.87–5.11)
RDW: 13 % (ref 11.5–15.5)
WBC: 12.2 10*3/uL — ABNORMAL HIGH (ref 4.0–10.5)
nRBC: 0 % (ref 0.0–0.2)

## 2018-03-20 MED FILL — Thrombin For Soln 5000 Unit: CUTANEOUS | Qty: 5000 | Status: AC

## 2018-03-20 MED FILL — Gelatin Absorbable MT Powder: OROMUCOSAL | Qty: 1 | Status: AC

## 2018-03-20 NOTE — Evaluation (Signed)
Occupational Therapy Evaluation Patient Details Name: Kylie Holmes MRN: [id number] DOB: 25-Feb-1945 Today's Date: 03/20/2018    History of Present Illness Pt s/p L4-L5 fusion   Clinical Impression   This 73 yo female admitted and underwent above presents to acute OT with back precautions post surgery affecting how she needs to do her basic ADLs. She will benefit from acute OT with HHOT follow up.    Follow Up Recommendations  Home health OT;Supervision/Assistance - 24 hour    Equipment Recommendations  None recommended by OT       Precautions / Restrictions Precautions Precautions: Back Precaution Booklet Issued: Yes (comment) Required Braces or Orthoses: Spinal Brace Spinal Brace: Applied in sitting position Restrictions Weight Bearing Restrictions: No      Mobility Bed Mobility Overal bed mobility: Needs Assistance Bed Mobility: Rolling;Sidelying to Sit Rolling: Supervision Sidelying to sit: Supervision       General bed mobility comments: VCs for technique, HOB down, use of rail  Transfers Overall transfer level: Needs assistance Equipment used: Rolling walker (2 wheeled) Transfers: Sit to/from Stand Sit to Stand: Min guard              Balance Overall balance assessment: No apparent balance deficits (not formally assessed)                                         ADL either performed or assessed with clinical judgement   ADL Overall ADL's : Needs assistance/impaired Eating/Feeding: Independent;Sitting   Grooming: Supervision/safety;Set up;Sitting   Upper Body Bathing: Supervision/ safety;Set up;Sitting   Lower Body Bathing: Moderate assistance Lower Body Bathing Details (indicate cue type and reason): min guard A sit<>stand Upper Body Dressing : Set up;Supervision/safety;Sitting   Lower Body Dressing: Moderate assistance Lower Body Dressing Details (indicate cue type and reason): min guard A sit<>stand Toilet Transfer: Min  guard;Ambulation;Comfort height toilet;RW   Toileting- Clothing Manipulation and Hygiene: Moderate assistance Toileting - Clothing Manipulation Details (indicate cue type and reason): min guard A sit<>stand       General ADL Comments: Pt and dtr-in-law educated on use of 2 cups for brushing teeth, use of wet wipes for peri care, not sitting for more than 20-30 minutes at a time, ,stance for sit<>stand to keep back more straight     Vision Patient Visual Report: No change from baseline              Pertinent Vitals/Pain Pain Assessment: No/denies pain     Hand Dominance Right   Extremity/Trunk Assessment Upper Extremity Assessment Upper Extremity Assessment: Overall WFL for tasks assessed           Communication Communication Communication: No difficulties   Cognition Arousal/Alertness: Awake/alert Behavior During Therapy: WFL for tasks assessed/performed Overall Cognitive Status: Within Functional Limits for tasks assessed                                                Home Living Family/patient expects to be discharged to:: Private residence Living Arrangements: Alone Available Help at Discharge: Family;Available PRN/intermittently Type of Home: House Home Access: Stairs to enter [employer] of Steps: 6 Entrance Stairs-Rails: Right;Left Home Layout: One level     Bathroom Shower/Tub: Walk-in shower;Door   Bathroom Toilet: Handicapped height  Home Equipment: Kasandra Knudsen - single point;Walker - 2 wheels;Shower seat;Grab bars - tub/shower;Hand held shower head          Prior Functioning/Environment Level of Independence: Independent with assistive device(s)        Comments: Used walker most of time        OT Problem List: Decreased strength;Decreased range of motion;Decreased knowledge of use of DME or AE;Decreased knowledge of precautions      OT Treatment/Interventions: Self-care/ADL training    OT Goals(Current  goals can be found in the care plan section) Acute Rehab OT Goals Patient Stated Goal: hopefully home tomorrow OT Goal Formulation: With patient/family Time For Goal Achievement: 03/27/18 Potential to Achieve Goals: Good  OT Frequency: Min 2X/week              AM-PAC OT "6 Clicks" Daily Activity     Outcome Measure Help from another person eating meals?: None Help from another person taking care of personal grooming?: A Little Help from another person toileting, which includes using toliet, bedpan, or urinal?: A Lot Help from another person bathing (including washing, rinsing, drying)?: A Little Help from another person to put on and taking off regular upper body clothing?: A Little Help from another person to put on and taking off regular lower body clothing?: A Lot 6 Click Score: 17   End of Session Equipment Utilized During Treatment: Gait belt;Rolling walker;Back brace Nurse Communication: Mobility status(OK for dtr-in-law to walk with pt in room)  Activity Tolerance: Patient tolerated treatment well Patient left: in chair;with call bell/phone within reach;with family/visitor present  OT Visit Diagnosis: Unsteadiness on feet (R26.81);Muscle weakness (generalized) (M62.81)                Time: 0076-2263 OT Time Calculation (min): 45 min Charges:  OT General Charges $OT Visit: 1 Visit OT Evaluation $OT Eval Moderate Complexity: 1 Mod OT Treatments $Self Care/Home Management : 23-37 mins  Golden Circle, OTR/L Acute [employer] Pager [phone] Office [phone]     Almon Register 03/20/2018, 9:35 AM

## 2018-03-20 NOTE — Progress Notes (Signed)
Dr. Ellene Route advised patient has not voided since foley was removed approximately 6:00 this morning.  Per Dr. Ellene Route, D/C Toradol, in-and-out cath if patient does not void by 1700, check BMET if UO less than 100 with I&O cath.

## 2018-03-20 NOTE — Evaluation (Addendum)
Physical Therapy Evaluation Patient Details Name: Kylie Holmes MRN: [id number] DOB: 16-Oct-1945 Today's Date: 03/20/2018   History of Present Illness  Pt s/p L4-L5 fusion  Clinical Impression  Pt admitted with above diagnosis. Pt currently with functional limitations due to the deficits listed below (see PT Problem List). Pt was able to ambulate with rW with min guard assist and cues.  Pt needing cues for back precautions.  Should progress well.   Pt will benefit from skilled PT to increase their independence and safety with mobility to allow discharge to the venue listed below.      Follow Up Recommendations Home health PT;Supervision - Intermittent    Equipment Recommendations  None recommended by PT    Recommendations for Other Services       Precautions / Restrictions Precautions Precautions: Back Precaution Booklet Issued: Yes (comment) Required Braces or Orthoses: Spinal Brace Spinal Brace: Applied in sitting position Restrictions Weight Bearing Restrictions: No      Mobility  Bed Mobility Overal bed mobility: Needs Assistance Bed Mobility: Rolling;Sidelying to Sit Rolling: Supervision Sidelying to sit: Supervision       General bed mobility comments: Pt was in chair on arrival  Transfers Overall transfer level: Needs assistance Equipment used: Rolling walker (2 wheeled) Transfers: Sit to/from Stand Sit to Stand: Min guard         General transfer comment: Cues needed for hand placement as pt initially pulling up on RW.    Ambulation/Gait Ambulation/Gait assistance: Min guard Gait Distance (Feet): 275 Feet Assistive device: Rolling walker (2 wheeled) Gait Pattern/deviations: Step-through pattern;Decreased stride length;Trunk flexed;Antalgic   Gait velocity interpretation: <1.31 ft/sec, indicative of household ambulator General Gait Details: Pt was able to ambulate with RW with min guard assist with cues with turns for walker safety. Also constant cues  to stand tall as pt tends to lean forward slightly.    Stairs            Wheelchair Mobility    Modified Rankin (Stroke Patients Only)       Balance Overall balance assessment: No apparent balance deficits (not formally assessed)                                           Pertinent Vitals/Pain Pain Assessment: No/denies pain    Home Living Family/patient expects to be discharged to:: Private residence Living Arrangements: Alone Available Help at Discharge: Family;Available PRN/intermittently Type of Home: House Home Access: Stairs to enter Entrance Stairs-Rails: Psychiatric nurse of Steps: 6 Home Layout: One level Home Equipment: Cane - single point;Walker - 2 wheels;Shower seat;Grab bars - tub/shower;Hand held shower head      Prior Function Level of Independence: Independent with assistive device(s)         Comments: Used walker most of time     Hand Dominance   Dominant Hand: Right    Extremity/Trunk Assessment   Upper Extremity Assessment Upper Extremity Assessment: Defer to OT evaluation    Lower Extremity Assessment Lower Extremity Assessment: Generalized weakness    Cervical / Trunk Assessment Cervical / Trunk Assessment: Normal  Communication   Communication: No difficulties  Cognition Arousal/Alertness: Awake/alert Behavior During Therapy: WFL for tasks assessed/performed Overall Cognitive Status: Within Functional Limits for tasks assessed  General Comments General comments (skin integrity, edema, etc.): Reviewed brace application and back precautions    Exercises     Assessment/Plan    PT Assessment Patient needs continued PT services  PT Problem List Decreased activity tolerance;Decreased balance;Decreased mobility;Decreased knowledge of use of DME;Decreased safety awareness;Decreased knowledge of precautions       PT Treatment  Interventions DME instruction;Gait training;Functional mobility training;Therapeutic activities;Stair training;Therapeutic exercise;Balance training;Patient/family education    PT Goals (Current goals can be found in the Care Plan section)  Acute Rehab PT Goals Patient Stated Goal: hopefully home tomorrow PT Goal Formulation: With patient Time For Goal Achievement: 04/03/18 Potential to Achieve Goals: Good    Frequency Min 5X/week   Barriers to discharge        Co-evaluation               AM-PAC PT "6 Clicks" Mobility  Outcome Measure Help needed turning from your back to your side while in a flat bed without using bedrails?: A Little Help needed moving from lying on your back to sitting on the side of a flat bed without using bedrails?: A Little Help needed moving to and from a bed to a chair (including a wheelchair)?: A Little Help needed standing up from a chair using your arms (e.g., wheelchair or bedside chair)?: A Little Help needed to walk in hospital room?: A Little Help needed climbing 3-5 steps with a railing? : A Little 6 Click Score: 18    End of Session Equipment Utilized During Treatment: Gait belt;Back brace Activity Tolerance: Patient limited by fatigue Patient left: in chair;with call bell/phone within reach;with chair alarm set;with family/visitor present Nurse Communication: Mobility status PT Visit Diagnosis: Muscle weakness (generalized) (M62.81)    Time: 1607-3710 PT Time Calculation (min) (ACUTE ONLY): 20 min   Charges:   PT Evaluation $PT Eval Moderate Complexity: 1 Mod          Galit Urich,PT Acute Rehabilitation Services Pager:  [phone]  Office:  [place] 03/20/2018, 10:04 AM

## 2018-03-20 NOTE — Progress Notes (Signed)
Patient ID: Kylie Holmes, female   DOB: [birth year], 73 y.o.   MRN: [id number] Vital signs are stable Patient's hematocrit has been 27 postop Urine output is still pending for today since Foley catheter removed We will DC Toradol If urine output remains low we will recheck be met Patient is feeling well and probably ready for discharge tomorrow morning

## 2018-03-20 NOTE — Social Work (Signed)
CSW acknowledging consult for SNF placement. Will follow for therapy recommendations.   Sharleen Szczesny, MSW, LCSWA [place] Clinical Social Work [phone]   

## 2018-03-21 MED ORDER — METHOCARBAMOL 500 MG PO TABS: 500.0000 mg | ORAL_TABLET | Freq: Four times a day (QID) | ORAL | 3 refills | Status: AC | PRN

## 2018-03-21 MED ORDER — OXYCODONE-ACETAMINOPHEN 5-325 MG PO TABS: 1.0000 | ORAL_TABLET | ORAL | 0 refills | Status: AC | PRN

## 2018-03-21 NOTE — Discharge Summary (Signed)
Physician Discharge Summary  Patient ID: Kylie Holmes MRN: [id number] DOB/AGE: Jan 21, 1946 73 y.o.  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  Consults: None  Significant Diagnostic Studies: None  Treatments: surgery: Laminectomy decompression L4-L5 with posterior lumbar interbody arthrodesis peek spacers local autograft allograft infuse, posterior fixation with pedicle screws  Discharge Exam: Blood pressure 130/62, pulse 79, temperature 98 F (36.7 C), temperature source Oral, resp. rate 16, height 5\' 3"  (1.6 m), weight 78.2 kg, SpO2 98 %. Incision is clean and dry.  Station and gait are intact though slow.  Disposition: Discharge disposition: 01-Home or Self Care       Discharge Instructions    Call MD for:  redness, tenderness, or signs of infection (pain, swelling, redness, odor or green/yellow discharge around incision site)   Complete by:  As directed    Call MD for:  severe uncontrolled pain   Complete by:  As directed    Call MD for:  temperature >100.4   Complete by:  As directed    Diet - low sodium heart healthy   Complete by:  As directed    Incentive spirometry RT   Complete by:  As directed    Increase activity slowly   Complete by:  As directed      Allergies as of 03/21/2018   No Known Allergies     Medication List    TAKE these medications   HAIR SKIN AND NAILS FORMULA PO Take 1 tablet by mouth every other day.   levothyroxine 75 MCG tablet Commonly known as:  SYNTHROID, LEVOTHROID Take 75 mcg by mouth daily before  breakfast.   methocarbamol 500 MG tablet Commonly known as:  ROBAXIN Take 500 mg by mouth every 8 (eight) hours as needed for muscle spasms. What changed:  Another medication with the same name was added. Make sure you understand how and when to take each.   methocarbamol 500 MG tablet Commonly known as:  ROBAXIN Take 1 tablet (500 mg total) by mouth every 6 (six) hours as needed for muscle spasms. What changed:  You were already taking a medication with the same name, and this prescription was added. Make sure you understand how and when to take each.   naproxen sodium 220 MG tablet Commonly known as:  ALEVE Take 440 mg by mouth daily as needed (pain).   oxyCODONE-acetaminophen 5-325 MG tablet Commonly known as:  PERCOCET/ROXICET Take 1-2 tablets by mouth every 3 (three) hours as needed for moderate pain or severe pain.        Signed: Blanchie Dessert Nezar Buckles 03/21/2018, 9:09 AM

## 2018-03-21 NOTE — Progress Notes (Signed)
Occupational Therapy Treatment Patient Details Name: Kylie Holmes MRN: [id number] DOB: 23-Sep-1945 Today's Date: 03/21/2018    History of present illness Pt s/p L4-L5 fusion   OT comments  Pt progressing well.  Plan to dc home today with 24/7 care.  Patient educated on AE for LB dressing and toileting, reports purchased reacher for home. Demonstrated use of toileting aide, and return demonstrated use of reacher to simulate donning "underwear".  Increased time and effort for self care, min assist for toileting and min guard for toilet transfer.  Continues to require cueing for hand placement and safety, cueing to adhere to back precautions (able to recall 2/3 a beginning and end of session without cueing).  Will follow, HHOT recommended.    Follow Up Recommendations  Home health OT;Supervision/Assistance - 24 hour    Equipment Recommendations  None recommended by OT    Recommendations for Other Services      Precautions / Restrictions Precautions Precautions: Back Precaution Booklet Issued: Yes (comment) Precaution Comments: reviewed precautions with patinet, able to recall 2/3 without cueing  Required Braces or Orthoses: Spinal Brace Spinal Brace: (on upon entry) Restrictions Weight Bearing Restrictions: No       Mobility Bed Mobility               General bed mobility comments: EOB upon entry  Transfers Overall transfer level: Needs assistance Equipment used: Rolling walker (2 wheeled) Transfers: Sit to/from Stand Sit to Stand: Min guard         General transfer comment: cueing for hand placement and safety, body mechanics, increased time and effort    Balance Overall balance assessment: No apparent balance deficits (not formally assessed)                                         ADL either performed or assessed with clinical judgement   ADL Overall ADL's : Needs assistance/impaired     Grooming: Supervision/safety;Set  up;Standing Grooming Details (indicate cue type and reason): cueing to avoid forward lean to sink and upright posture     Lower Body Bathing: Minimal assistance;With adaptive equipment;Sitting/lateral leans Lower Body Bathing Details (indicate cue type and reason): reviewed precautions and use of long sponge, assist recommended- pt agreeable      Lower Body Dressing: Minimal assistance;Sit to/from stand;With adaptive equipment Lower Body Dressing Details (indicate cue type and reason): reivewed use of AE for LB dressing, reacher- simulated use with min assist; min guard in standing  Toilet Transfer: Min guard;Ambulation;RW;Cueing for safety Toilet Transfer Details (indicate cue type and reason): patient requires increased time, cueing for hand placement  Toileting- Clothing Manipulation and Hygiene: Minimal assistance;Sit to/from stand Toileting - Clothing Manipulation Details (indicate cue type and reason): min assist for clothing mgmt and hygiene- verbal cueing to precautions- reviewed use of AE (toileting aide) for toileting      Functional mobility during ADLs: Min guard;Rolling walker General ADL Comments: increased time and effort for all activities- verbalizes understanding with AE and precautions but patinet requires cueing to adhere to during functional tasks      Vision       Perception     Praxis      Cognition Arousal/Alertness: Awake/alert Behavior During Therapy: WFL for tasks assessed/performed Overall Cognitive Status: Within Functional Limits for tasks assessed  Exercises     Shoulder Instructions       General Comments Reviewed brace application and back precautions    Pertinent Vitals/ Pain       Pain Assessment: Faces Pain Score: 6  Faces Pain Scale: Hurts little more Pain Location: back Pain Descriptors / Indicators: Aching;Grimacing;Guarding Pain Intervention(s): Limited activity within  patient's tolerance;Repositioned  Home Living                                          Prior Functioning/Environment              Frequency  Min 2X/week        Progress Toward Goals  OT Goals(current goals can now be found in the care plan section)  Progress towards OT goals: Progressing toward goals  Acute Rehab OT Goals Patient Stated Goal: home today OT Goal Formulation: With patient/family Time For Goal Achievement: 03/27/18 Potential to Achieve Goals: Good  Plan Discharge plan remains appropriate;Frequency remains appropriate    Co-evaluation                 AM-PAC OT "6 Clicks" Daily Activity     Outcome Measure   Help from another person eating meals?: None Help from another person taking care of personal grooming?: A Little Help from another person toileting, which includes using toliet, bedpan, or urinal?: A Little Help from another person bathing (including washing, rinsing, drying)?: A Little Help from another person to put on and taking off regular upper body clothing?: A Little Help from another person to put on and taking off regular lower body clothing?: A Little 6 Click Score: 19    End of Session Equipment Utilized During Treatment: Rolling walker;Back brace  OT Visit Diagnosis: Unsteadiness on feet (R26.81);Muscle weakness (generalized) (M62.81)   Activity Tolerance Patient tolerated treatment well   Patient Left with call bell/phone within reach;with family/visitor present;Other (comment)(seated EOB)   Nurse Communication Mobility status        Time: 7867-5449 OT Time Calculation (min): 21 min  Charges: OT General Charges $OT Visit: 1 Visit OT Treatments $Self Care/Home Management : 8-22 mins  Delight Stare, OT Acute Rehabilitation Services Pager [phone] Office [phone]    Delight Stare 03/21/2018, 12:09 PM

## 2018-03-21 NOTE — Progress Notes (Signed)
Physical Therapy Treatment Patient Details Name: Kylie Holmes MRN: [id number] DOB: 1945-10-01 Today's Date: 03/21/2018    History of Present Illness Pt s/p L4-L5 fusion    PT Comments    Pt admitted with above diagnosis. Pt currently with functional limitations due to the deficits listed below (see PT Problem List). Pt was able to ambulate with RW with min guard assist and went up and down stairs with cues and min assist.  Pt with incr pain today with limited ambulation since pt practiced steps.  Feels ready to go home and answered all pts' questions.  Progressing well.   Pt will benefit from skilled PT to increase their independence and safety with mobility to allow discharge to the venue listed below.     Follow Up Recommendations  Home health PT;Supervision - Intermittent     Equipment Recommendations  None recommended by PT    Recommendations for Other Services       Precautions / Restrictions Precautions Precautions: Back Precaution Booklet Issued: Yes (comment) Required Braces or Orthoses: Spinal Brace Spinal Brace: Applied in sitting position Restrictions Weight Bearing Restrictions: No    Mobility  Bed Mobility               General bed mobility comments: Pt on EOB on arrival. Brace not on as pt had just sat up per daughter.  Pt placed brace on with cues only.   Transfers Overall transfer level: Needs assistance Equipment used: Rolling walker (2 wheeled) Transfers: Sit to/from Stand Sit to Stand: Min guard         General transfer comment: Cues needed for hand placement as pt initially pulling up on RW.  Pt needs incr time due to pain.    Ambulation/Gait Ambulation/Gait assistance: Min guard Gait Distance (Feet): 40 Feet(30 feet and 10 feet) Assistive device: Rolling walker (2 wheeled) Gait Pattern/deviations: Step-through pattern;Decreased stride length;Trunk flexed;Antalgic   Gait velocity interpretation: <1.31 ft/sec, indicative of household  ambulator General Gait Details: Pt was able to ambulate with RW with min guard assist with cues with turns for walker safety. Also constant cues to stand tall as pt tends to lean forward slightly.  Pt much more guarded today needing more cues.  Slow gait due to pain.    Stairs Stairs: Yes Stairs assistance: Min assist Stair Management: One rail Right;Step to pattern;Forwards Number of Stairs: 4 General stair comments: Pt able to ascend and descend steps with min assist as PT acted as rail on pts left - she has 2 rails at home.  Should progress well.     Wheelchair Mobility    Modified Rankin (Stroke Patients Only)       Balance Overall balance assessment: No apparent balance deficits (not formally assessed)                                          Cognition Arousal/Alertness: Awake/alert Behavior During Therapy: WFL for tasks assessed/performed Overall Cognitive Status: Within Functional Limits for tasks assessed                                        Exercises      General Comments General comments (skin integrity, edema, etc.): Reviewed brace application and back precautions      Pertinent Vitals/Pain Pain Assessment: 0-10  Pain Score: 6  Pain Location: back Pain Descriptors / Indicators: Aching;Grimacing;Guarding Pain Intervention(s): Limited activity within patient's tolerance;Monitored during session;Premedicated before session;Repositioned    Home Living                      Prior Function            PT Goals (current goals can now be found in the care plan section) Acute Rehab PT Goals Patient Stated Goal: hopefully home tomorrow Progress towards PT goals: Progressing toward goals    Frequency    Min 5X/week      PT Plan Current plan remains appropriate    Co-evaluation              AM-PAC PT "6 Clicks" Mobility   Outcome Measure  Help needed turning from your back to your side while in a flat  bed without using bedrails?: A Little Help needed moving from lying on your back to sitting on the side of a flat bed without using bedrails?: A Little Help needed moving to and from a bed to a chair (including a wheelchair)?: A Little Help needed standing up from a chair using your arms (e.g., wheelchair or bedside chair)?: A Little Help needed to walk in hospital room?: A Little Help needed climbing 3-5 steps with a railing? : A Little 6 Click Score: 18    End of Session Equipment Utilized During Treatment: Gait belt;Back brace Activity Tolerance: Patient limited by fatigue Patient left: in chair;with call bell/phone within reach;with chair alarm set;with family/visitor present Nurse Communication: Mobility status PT Visit Diagnosis: Muscle weakness (generalized) (M62.81)     Time: 9937-1696 PT Time Calculation (min) (ACUTE ONLY): 23 min  Charges:  $Gait Training: 8-22 mins $Self Care/Home Management: 8-22                     [place] Pager:  [phone]  Office:  [phone]     Denice Paradise 03/21/2018, 9:50 AM

## 2018-03-21 NOTE — Care Management Note (Signed)
Case Management Note  Patient Details  Name: Kylie Holmes MRN: [id number] Date of Birth: 1945-09-22  Subjective/Objective:   Pt admitted on 03/19/2018 s/p L4-L5 fusion.  PTA, pt independent with assistive devices; lives alone.                  Action/Plan: PT/OT recommending HH follow up with intermittent supervision, and pt agreeable to services.  Family able to provide care at discharge.  Referral to [facility] for [facility] follow up, per pt choice.  Pt denies any DME needs.    Expected Discharge Date:  03/21/18               Expected Discharge Plan:  [place]  In-House Referral:     Discharge planning Services  CM Consult  Post Acute Care Choice:  Home Health Choice offered to:     DME Arranged:    DME Agency:     HH Arranged:  PT, OT HH Agency:  [place]  Status of Service:  Completed, signed off  If discussed at [place] of Stay Meetings, dates discussed:    Additional Comments:  Reinaldo Raddle, RN, BSN  Trauma/Neuro ICU Case Manager [phone]

## 2018-05-30 ENCOUNTER — Other Ambulatory Visit: Payer: Self-pay

## 2018-05-30 ENCOUNTER — Other Ambulatory Visit (HOSPITAL_COMMUNITY): Payer: Self-pay | Admitting: Neurological Surgery

## 2018-05-30 ENCOUNTER — Ambulatory Visit (HOSPITAL_COMMUNITY)
Admission: RE | Admit: 2018-05-30 | Discharge: 2018-05-30 | Disposition: A | Discharge status: Home / Self Care | Payer: Medicare Other | Source: Ambulatory Visit | Attending: Family | Admitting: Family

## 2018-05-30 DIAGNOSIS — M7989 Other specified soft tissue disorders: Secondary | ICD-10-CM | POA: Diagnosis present

## 2018-05-30 NOTE — Progress Notes (Signed)
Bilateral lower extremity venous duplex performed. Preliminary results given to Jovana at ordering provider's office. Patient instructed to return home.

## 2019-03-22 ENCOUNTER — Ambulatory Visit: Payer: Medicare Other | Attending: Internal Medicine

## 2019-03-22 DIAGNOSIS — Z23 Encounter for immunization: Secondary | ICD-10-CM

## 2019-03-22 NOTE — Progress Notes (Signed)
   Covid-19 Vaccination Clinic  Name:  ASMA HARVARD    MRN: [id number] DOB: 09/28/1945  03/22/2019  Ms. Sou was observed post Covid-19 immunization for 15 minutes without incidence. She was provided with Vaccine Information Sheet and instruction to access the V-Safe system.   Ms. Turnbull was instructed to call 911 with any severe reactions post vaccine: Marland Kitchen Difficulty breathing  . Swelling of your face and throat  . A fast heartbeat  . A bad rash all over your body  . Dizziness and weakness    Immunizations Administered    Name Date Dose VIS Date Route   Pfizer COVID-19 Vaccine 03/22/2019 10:26 AM 0.3 mL 01/18/2019 Intramuscular   Manufacturer: [place]   Lot: [id number]   [place]: [id number]

## 2019-04-14 ENCOUNTER — Ambulatory Visit: Payer: Medicare Other | Attending: Internal Medicine

## 2019-04-14 DIAGNOSIS — Z23 Encounter for immunization: Secondary | ICD-10-CM | POA: Insufficient documentation

## 2019-04-14 NOTE — Progress Notes (Signed)
   Covid-19 Vaccination Clinic  Name:  Kylie Holmes    MRN: [id number] DOB: [birth year]  04/14/2019  [name] was observed post Covid-19 immunization for 15 minutes without incident. She was provided with Vaccine Information Sheet and instruction to access the V-Safe system.   [name] was instructed to call 911 with any severe reactions post vaccine: Marland Kitchen Difficulty breathing  . Swelling of face and throat  . A fast heartbeat  . A bad rash all over body  . Dizziness and weakness   Immunizations Administered    Name Date Dose VIS Date Route   Pfizer COVID-19 Vaccine 04/14/2019  9:49 AM 0.3 mL 01/18/2019 Intramuscular   Manufacturer: [place]   Lot: [id number]   [place]: [id number]

## 2019-09-29 IMAGING — RF DG MYELOGRAM LUMBAR
9 series · 9 of 9 positions shown · non-contrast
Comparison: MRI 06/12/2017

CLINICAL DATA: Worsening back and lower extremity symptoms.
Difficulty walking.

[Series 1: cp_standard · 0.29mm/px · 1 of 1 slices shown (1 of 2)]
[im 1/1]
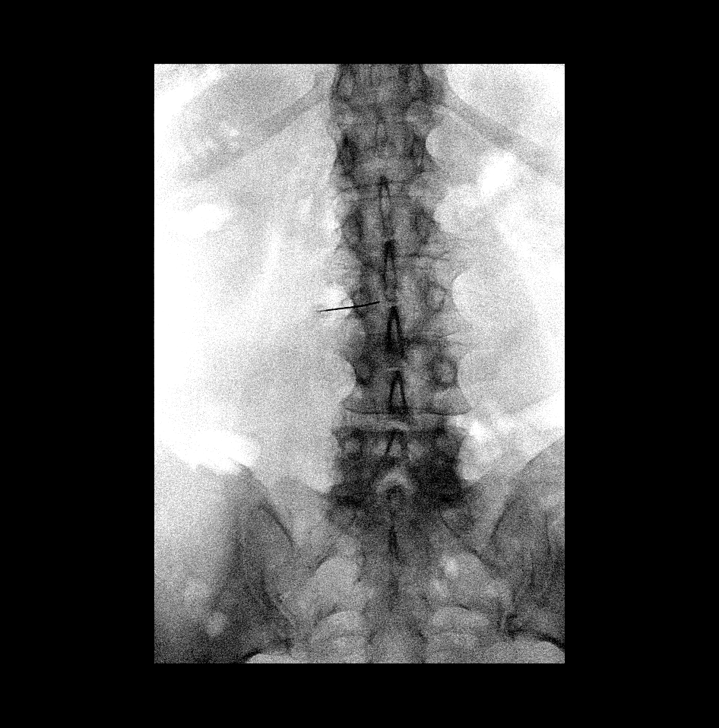

[Series 2: cp_standard · 0.28mm/px · 1 of 1 slices shown (2 of 2)]
[im 1/1]
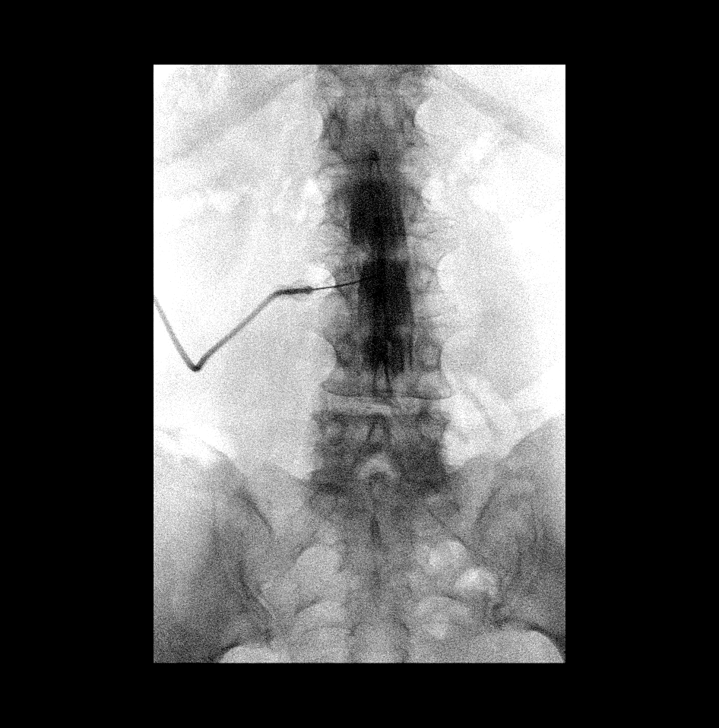

[Series 3: fluoro_myelogram_singleshot_bw · 0.21mm/px · 1 of 1 slices shown (1 of 7)]
[im 1/1]
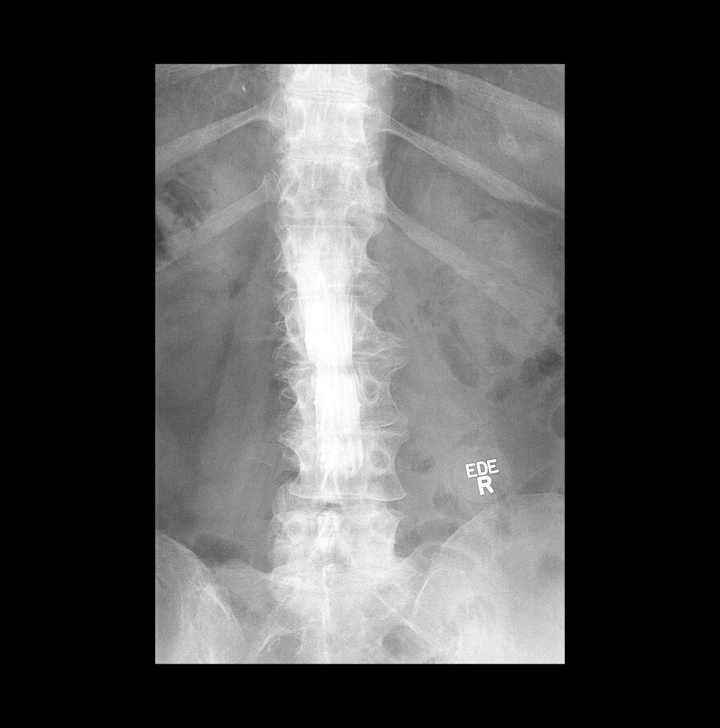

[Series 4: fluoro_myelogram_singleshot_bw · 0.20mm/px · 1 of 1 slices shown (2 of 7)]
[im 1/1]
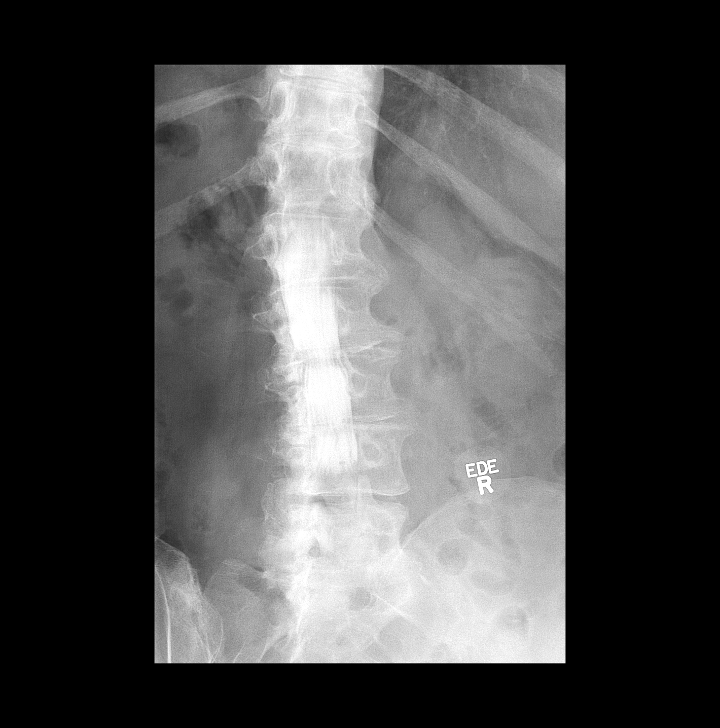

[Series 5: fluoro_myelogram_singleshot_bw · 0.19mm/px · 1 of 1 slices shown (3 of 7)]
[im 1/1]
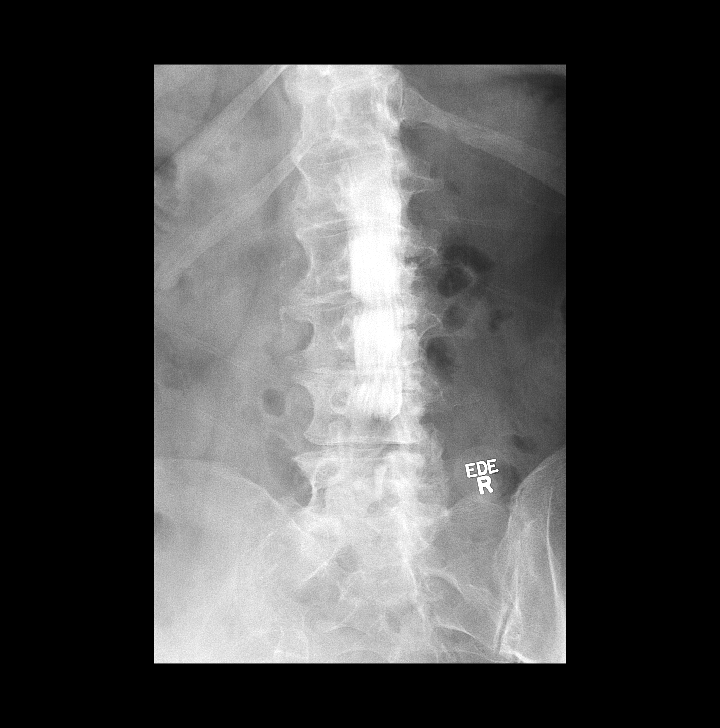

[Series 6: fluoro_myelogram_singleshot_bw · 0.17mm/px · 1 of 1 slices shown (4 of 7)]
[im 1/1]
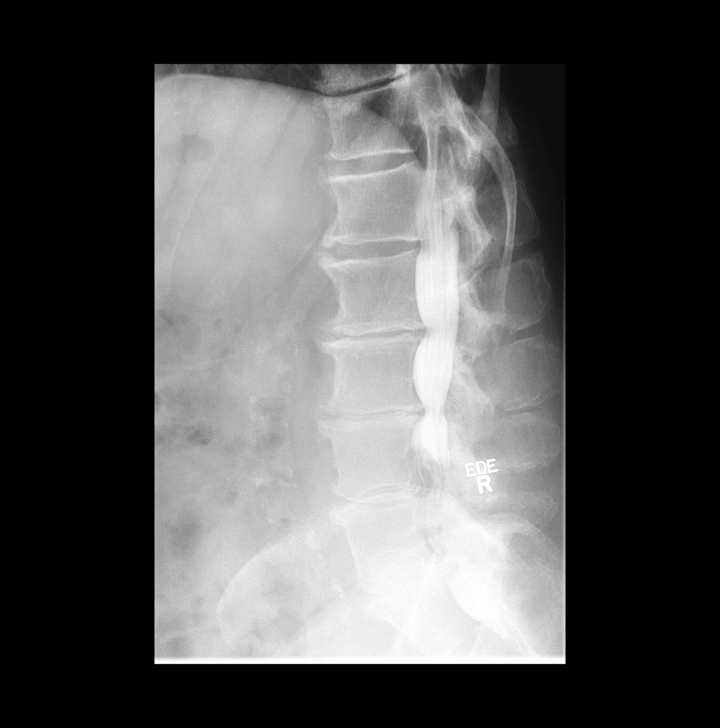

[Series 7: fluoro_myelogram_singleshot_bw · 0.17mm/px · 1 of 1 slices shown (5 of 7)]
[im 1/1]
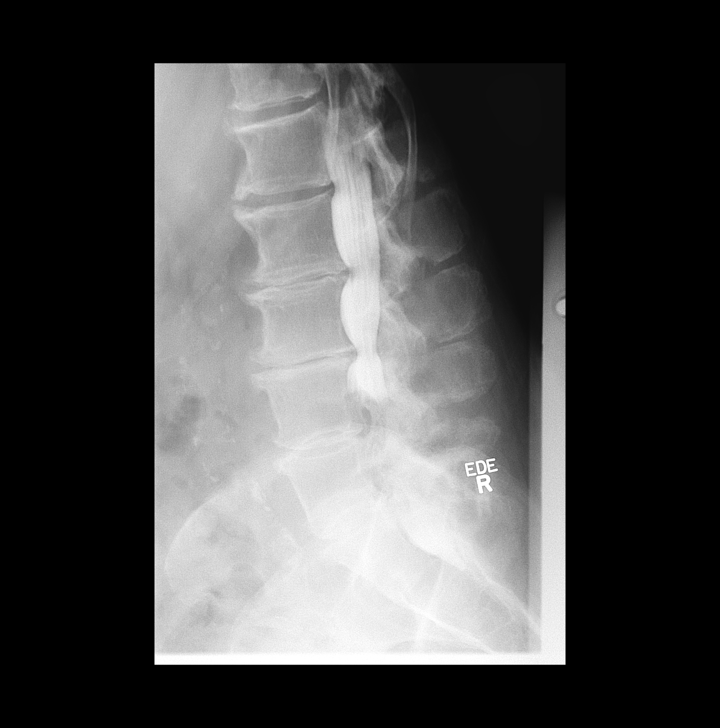

[Series 8: fluoro_myelogram_singleshot_bw · 0.18mm/px · 1 of 1 slices shown (6 of 7)]
[im 1/1]
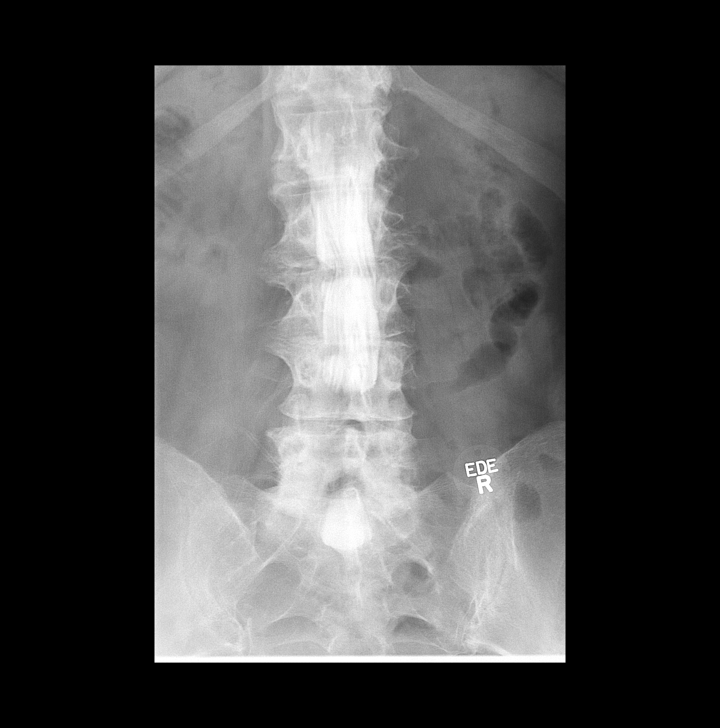

[Series 9: fluoro_myelogram_singleshot_bw · 0.18mm/px · 1 of 1 slices shown (7 of 7)]
[im 1/1]
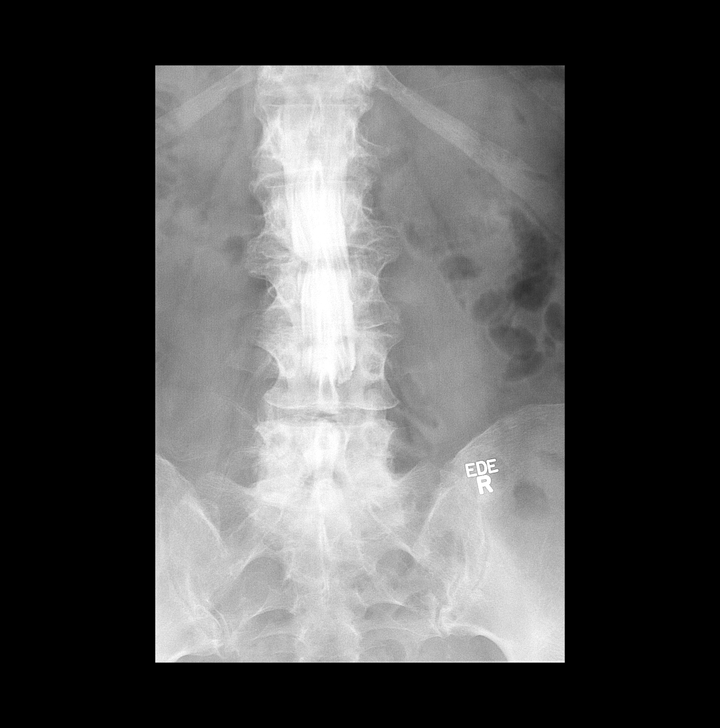

[9 of 9 positions shown; findings below may reference images not displayed]

FLUOROSCOPY TIME:  0 minutes 30 seconds. 785.20 micro gray meter
squared

PROCEDURE:
LUMBAR MYELOGRAM

Lumbar puncture was performed by Dr. Lul on the left at the L2-3
interspace. Filming was performed by myself.

CT MYELOGRAM LUMBAR

Procedure: CT imaging of the lumbar spine was performed after
intrathecal contrast administration. Multiplanar CT image
reconstructions were also generated.
FINDINGS: LUMBAR MYELOGRAM FINDINGS:

Mild lateral recess stenosis at L2-3 left more than right. Small
anterior extradural defects at L1-2, L2-3 and L3-4. Large anterior
extradural defect at L4-5 consistent with a large disc herniation
with upward and downward migration. Marked compression of the thecal
sac. L5-S1 shows mild subarticular lateral recess narrowing but no
definite compressive stenosis.

CT LUMBAR MYELOGRAM FINDINGS:

T11-12: Chronic disc degeneration with annular bulging and annular
calcification narrows the ventral subarachnoid space but does not
cause neural compression.

T12-L1: Mild disc bulge.  No stenosis.

L1-2: Mild disc bulge.  No stenosis.

L2-3: Chronic disc degeneration with annular bulging and annular
calcification. Facet and ligamentous hypertrophy left more than
right. Narrowing of the left lateral recess with some potential for
neural compression in this location.

L3-4: Chronic disc degeneration with endplate osteophytes and
annular calcification. Facet and ligamentous hypertrophy and
calcification. Mild multifactorial canal and lateral recess stenosis
left more than right.

L4-5: Large disc herniation including migration of disc material
upward and downward. Disc herniation slightly more prominent towards
the left. Marked compression of the thecal sac. Nerve root
compression could occur on either or both sides. Mild facet and
ligamentous hypertrophy.

L5-S1: Chronic disc degeneration with loss of disc height. Endplate
osteophytes with annular bulging and annular calcification. Facet
and ligamentous hypertrophy. Mild stenosis of both subarticular
lateral recesses. Moderate bilateral foraminal narrowing.
IMPRESSION: Large disc herniation at L4-5 with upward and downward migration.
Severe spinal stenosis with compression of the thecal sac. Neural
compression likely on both sides.

Lateral recess narrowing at L2-3 left more than right.

Mild lateral recess narrowing at L3-4 and L5-S1.

## 2019-11-04 ENCOUNTER — Encounter: Payer: Self-pay | Admitting: Gastroenterology

## 2019-11-27 ENCOUNTER — Other Ambulatory Visit: Payer: Self-pay

## 2019-11-27 ENCOUNTER — Ambulatory Visit (AMBULATORY_SURGERY_CENTER): Payer: Self-pay | Admitting: *Deleted

## 2019-11-27 VITALS — Ht 63.0 in | Wt 188.0 lb

## 2019-11-27 DIAGNOSIS — Z1211 Encounter for screening for malignant neoplasm of colon: Secondary | ICD-10-CM

## 2019-11-27 MED ORDER — PLENVU 140 G PO SOLR: 1.0000 | Freq: Once | ORAL | 0 refills | Status: AC

## 2019-11-27 NOTE — Progress Notes (Signed)
No egg or soy allergy known to patient  No issues with past sedation with any surgeries or procedures no intubation problems in the past  No FH of Malignant Hyperthermia No diet pills per patient No home 02 use per patient  No blood thinners per patient  Pt denies issues with constipation  No A fib or A flutter  EMMI video to pt or via [place] 19 guidelines implemented in PV today with Pt and RN   Medicare plenvu Coupon given to pt in [facility] today    Due to the COVID-19 pandemic we are asking patients to follow these guidelines. Please only bring one care partner. Please be aware that your care partner may wait in the car in the parking lot or if they feel like they will be too hot to wait in the car, they may wait in the lobby on the 4th floor. All care partners are required to wear a mask the entire time (we do not have any that we can provide them), they need to practice social distancing, and we will do a Covid check for all patient's and care partners when you arrive. Also we will check their temperature and your temperature. If the care partner waits in their car they need to stay in the parking lot the entire time and we will call them on their cell phone when the patient is ready for discharge so they can bring the car to the front of the building. Also all patient's will need to wear a mask into building.

## 2019-11-28 ENCOUNTER — Encounter: Payer: Self-pay | Admitting: Gastroenterology

## 2019-12-25 ENCOUNTER — Other Ambulatory Visit: Payer: Self-pay

## 2019-12-25 ENCOUNTER — Encounter: Payer: Self-pay | Admitting: Gastroenterology

## 2019-12-25 ENCOUNTER — Ambulatory Visit (AMBULATORY_SURGERY_CENTER): Payer: Medicare Other | Admitting: Gastroenterology

## 2019-12-25 VITALS — BP 112/53 | HR 50 | Temp 97.8°F | Resp 13

## 2019-12-25 DIAGNOSIS — Z1211 Encounter for screening for malignant neoplasm of colon: Secondary | ICD-10-CM

## 2019-12-25 DIAGNOSIS — D123 Benign neoplasm of transverse colon: Secondary | ICD-10-CM

## 2019-12-25 DIAGNOSIS — D128 Benign neoplasm of rectum: Secondary | ICD-10-CM

## 2019-12-25 DIAGNOSIS — D125 Benign neoplasm of sigmoid colon: Secondary | ICD-10-CM | POA: Diagnosis not present

## 2019-12-25 DIAGNOSIS — D122 Benign neoplasm of ascending colon: Secondary | ICD-10-CM

## 2019-12-25 MED ORDER — SODIUM CHLORIDE 0.9 % IV SOLN: 500.0000 mL | Freq: Once | INTRAVENOUS | Status: DC

## 2019-12-25 NOTE — Progress Notes (Signed)
To PACU, VSS. Report to [email or web address] 

## 2019-12-25 NOTE — Progress Notes (Signed)
Called to room to assist during endoscopic procedure.  Patient ID and intended procedure confirmed with present staff. Received instructions for my participation in the procedure from the performing physician.  

## 2019-12-25 NOTE — Progress Notes (Signed)
Pt's states no medical or surgical changes since previsit or office visit. 

## 2019-12-25 NOTE — Progress Notes (Signed)
C.W. vital signs. 

## 2019-12-25 NOTE — Patient Instructions (Signed)
Please read handouts provided. Continue present medications. Await pathology results. High Fiber Diet.   YOU HAD AN ENDOSCOPIC PROCEDURE TODAY AT THE [facility]:   Refer to the procedure report that was given to you for any specific questions about what was found during the examination.  If the procedure report does not answer your questions, please call your gastroenterologist to clarify.  If you requested that your care partner not be given the details of your procedure findings, then the procedure report has been included in a sealed envelope for you to review at your convenience later.  YOU SHOULD EXPECT: Some feelings of bloating in the abdomen. Passage of more gas than usual.  Walking can help get rid of the air that was put into your GI tract during the procedure and reduce the bloating. If you had a lower endoscopy (such as a colonoscopy or flexible sigmoidoscopy) you may notice spotting of blood in your stool or on the toilet paper. If you underwent a bowel prep for your procedure, you may not have a normal bowel movement for a few days.  Please Note:  You might notice some irritation and congestion in your nose or some drainage.  This is from the oxygen used during your procedure.  There is no need for concern and it should clear up in a day or so.  SYMPTOMS TO REPORT IMMEDIATELY:  Following lower endoscopy (colonoscopy or flexible sigmoidoscopy):  Excessive amounts of blood in the stool  Significant tenderness or worsening of abdominal pains  Swelling of the abdomen that is new, acute  Fever of 100F or higher   For urgent or emergent issues, a gastroenterologist can be reached at any hour by calling [phone]. Do not use MyChart messaging for urgent concerns.    DIET:  We do recommend a small meal at first, but then you may proceed to your regular diet.  Drink plenty of fluids but you should avoid alcoholic beverages for 24 hours.  ACTIVITY:  You should plan  to take it easy for the rest of today and you should NOT DRIVE or use heavy machinery until tomorrow (because of the sedation medicines used during the test).    FOLLOW UP: Our staff will call the number listed on your records 48-72 hours following your procedure to check on you and address any questions or concerns that you may have regarding the information given to you following your procedure. If we do not reach you, we will leave a message.  We will attempt to reach you two times.  During this call, we will ask if you have developed any symptoms of COVID 19. If you develop any symptoms (ie: fever, flu-like symptoms, shortness of breath, cough etc.) before then, please call [phone].  If you test positive for Covid 19 in the 2 weeks post procedure, please call and report this information to us.    If any biopsies were taken you will be contacted by phone or by letter within the next 1-3 weeks.  Please call us at [phone] if you have not heard about the biopsies in 3 weeks.    SIGNATURES/CONFIDENTIALITY: You and/or your care partner have signed paperwork which will be entered into your electronic medical record.  These signatures attest to the fact that that the information above on your After Visit Summary has been reviewed and is understood.  Full responsibility of the confidentiality of this discharge information lies with you and/or your care-partner.  

## 2019-12-25 NOTE — Op Note (Signed)
[facility] Patient Name: Kylie Holmes Procedure Date: 12/25/2019 9:24 AM MRN: [id number] Endoscopist: Ladene Artist , MD Age: 74 Referring MD:  Date of Birth: 03-24-1945 Gender: Female Account #: [id number] Procedure:                Colonoscopy Indications:              Screening for colorectal malignant neoplasm Medicines:                Monitored Anesthesia Care Procedure:                Pre-Anesthesia Assessment:                           - Prior to the procedure, a History and Physical                            was performed, and patient medications and                            allergies were reviewed. The patient's tolerance of                            previous anesthesia was also reviewed. The risks                            and benefits of the procedure and the sedation                            options and risks were discussed with the patient.                            All questions were answered, and informed consent                            was obtained. Prior Anticoagulants: The patient has                            taken no previous anticoagulant or antiplatelet                            agents. ASA Grade Assessment: II - A patient with                            mild systemic disease. After reviewing the risks                            and benefits, the patient was deemed in                            satisfactory condition to undergo the procedure.                           After obtaining informed consent, the colonoscope  was passed under direct vision. Throughout the                            procedure, the patient's blood pressure, pulse, and                            oxygen saturations were monitored continuously. The                            Colonoscope was introduced through the anus and                            advanced to the the cecum, identified by                            appendiceal orifice and  ileocecal valve. The                            ileocecal valve, appendiceal orifice, and rectum                            were photographed. Unable to safely retroflex due                            to a narrow rectal vault. The quality of the bowel                            preparation was excellent. The colonoscopy was                            performed without difficulty. The patient tolerated                            the procedure well. Scope In: 9:38:36 AM Scope Out: 10:04:14 AM Scope Withdrawal Time: 0 hours 20 minutes 56 seconds  Total Procedure Duration: 0 hours 25 minutes 38 seconds  Findings:                 The perianal and digital rectal examinations were                            normal.                           A 4 mm polyp was found in the transverse colon. The                            polyp was sessile. The polyp was removed with a                            cold biopsy forceps. Resection and retrieval were                            complete.  Seven sessile polyps were found in the rectum (2),                            sigmoid colon (2), transverse colon (2) and                            ascending colon (1). The polyps were 5 to 8 mm in                            size. These polyps were removed with a cold snare.                            Resection and retrieval were complete.                           A few small localized angiodysplastic lesions                            without bleeding were found in the cecum and at the                            ileocecal valve.                           Multiple medium-mouthed diverticula were found in                            the left colon. There was no evidence of                            diverticular bleeding.                           The exam was otherwise without abnormality on                            direct views. Complications:            No immediate complications. Estimated  blood loss:                            None. Estimated Blood Loss:     Estimated blood loss: none. Impression:               - One 4 mm polyp in the transverse colon, removed                            with a cold biopsy forceps. Resected and retrieved.                           - Seven 5 to 8 mm polyps in the rectum, in the                            sigmoid colon, in the transverse colon and in the  ascending colon, removed with a cold snare.                            Resected and retrieved.                           - A few non-bleeding colonic angiodysplastic                            lesions.                           - Mild diverticulosis in the left colon.                           - Otherwise normal appearing. Recommendation:           - Repeat colonoscopy date to be determined after                            pending pathology results are reviewed for                            surveillance based on pathology results.                           - Patient has a contact number available for                            emergencies. The signs and symptoms of potential                            delayed complications were discussed with the                            patient. Return to normal activities tomorrow.                            Written discharge instructions were provided to the                            patient.                           - High fiber diet.                           - Continue present medications.                           - Await pathology results. Ladene Artist, MD 12/25/2019 10:12:17 AM This report has been signed electronically.

## 2019-12-27 ENCOUNTER — Telehealth: Payer: Self-pay

## 2019-12-27 NOTE — Telephone Encounter (Signed)
  Follow up Call-  Call back number 12/25/2019  Post procedure Call Back phone  # [phone]  Permission to leave phone message Yes  Some recent data might be hidden     Patient questions:  Do you have a fever, pain , or abdominal swelling? No. Pain Score  0 *  Have you tolerated food without any problems? Yes.    Have you been able to return to your normal activities? Yes.    Do you have any questions about your discharge instructions: Diet   No. Medications  No. Follow up visit  No.  Do you have questions or concerns about your Care? No.  Actions: * If pain score is 4 or above: No action needed, pain <4.  1. Have you developed a fever since your procedure? No  2.   Have you had an respiratory symptoms (SOB or cough) since your procedure? No  3.   Have you tested positive for COVID 19 since your procedure No  4.   Have you had any family members/close contacts diagnosed with the COVID 19 since your procedure? No   If yes to any of these questions please route to Joylene John, RN and Joella Prince, RN

## 2020-01-14 ENCOUNTER — Encounter: Payer: Self-pay | Admitting: Gastroenterology

## 2020-08-05 DIAGNOSIS — Z1231 Encounter for screening mammogram for malignant neoplasm of breast: Secondary | ICD-10-CM | POA: Diagnosis not present

## 2020-08-31 DIAGNOSIS — L570 Actinic keratosis: Secondary | ICD-10-CM | POA: Diagnosis not present

## 2020-08-31 DIAGNOSIS — Z85828 Personal history of other malignant neoplasm of skin: Secondary | ICD-10-CM | POA: Diagnosis not present

## 2020-08-31 DIAGNOSIS — D225 Melanocytic nevi of trunk: Secondary | ICD-10-CM | POA: Diagnosis not present

## 2020-08-31 DIAGNOSIS — L723 Sebaceous cyst: Secondary | ICD-10-CM | POA: Diagnosis not present

## 2020-08-31 DIAGNOSIS — L821 Other seborrheic keratosis: Secondary | ICD-10-CM | POA: Diagnosis not present

## 2020-09-17 DIAGNOSIS — H18832 Recurrent erosion of cornea, left eye: Secondary | ICD-10-CM | POA: Diagnosis not present

## 2020-10-07 DIAGNOSIS — Z0000 Encounter for general adult medical examination without abnormal findings: Secondary | ICD-10-CM | POA: Diagnosis not present

## 2020-10-07 DIAGNOSIS — G629 Polyneuropathy, unspecified: Secondary | ICD-10-CM | POA: Diagnosis not present

## 2020-10-07 DIAGNOSIS — R7303 Prediabetes: Secondary | ICD-10-CM | POA: Diagnosis not present

## 2020-10-07 DIAGNOSIS — E039 Hypothyroidism, unspecified: Secondary | ICD-10-CM | POA: Diagnosis not present

## 2020-10-07 DIAGNOSIS — Z1389 Encounter for screening for other disorder: Secondary | ICD-10-CM | POA: Diagnosis not present

## 2020-10-07 DIAGNOSIS — M519 Unspecified thoracic, thoracolumbar and lumbosacral intervertebral disc disorder: Secondary | ICD-10-CM | POA: Diagnosis not present

## 2020-10-07 DIAGNOSIS — E559 Vitamin D deficiency, unspecified: Secondary | ICD-10-CM | POA: Diagnosis not present

## 2020-10-07 DIAGNOSIS — G603 Idiopathic progressive neuropathy: Secondary | ICD-10-CM | POA: Diagnosis not present

## 2020-10-07 DIAGNOSIS — E782 Mixed hyperlipidemia: Secondary | ICD-10-CM | POA: Diagnosis not present

## 2020-10-27 DIAGNOSIS — Z961 Presence of intraocular lens: Secondary | ICD-10-CM | POA: Diagnosis not present

## 2020-10-27 DIAGNOSIS — H18413 Arcus senilis, bilateral: Secondary | ICD-10-CM | POA: Diagnosis not present

## 2020-10-27 DIAGNOSIS — H26493 Other secondary cataract, bilateral: Secondary | ICD-10-CM | POA: Diagnosis not present

## 2020-10-27 DIAGNOSIS — H26492 Other secondary cataract, left eye: Secondary | ICD-10-CM | POA: Diagnosis not present

## 2020-10-27 DIAGNOSIS — H35371 Puckering of macula, right eye: Secondary | ICD-10-CM | POA: Diagnosis not present

## 2020-11-03 DIAGNOSIS — Z961 Presence of intraocular lens: Secondary | ICD-10-CM | POA: Diagnosis not present

## 2021-08-18 DIAGNOSIS — Z1231 Encounter for screening mammogram for malignant neoplasm of breast: Secondary | ICD-10-CM | POA: Diagnosis not present

## 2021-08-31 DIAGNOSIS — L720 Epidermal cyst: Secondary | ICD-10-CM | POA: Diagnosis not present

## 2021-08-31 DIAGNOSIS — L218 Other seborrheic dermatitis: Secondary | ICD-10-CM | POA: Diagnosis not present

## 2021-08-31 DIAGNOSIS — D225 Melanocytic nevi of trunk: Secondary | ICD-10-CM | POA: Diagnosis not present

## 2021-08-31 DIAGNOSIS — L821 Other seborrheic keratosis: Secondary | ICD-10-CM | POA: Diagnosis not present

## 2021-08-31 DIAGNOSIS — Z85828 Personal history of other malignant neoplasm of skin: Secondary | ICD-10-CM | POA: Diagnosis not present

## 2021-10-15 DIAGNOSIS — E039 Hypothyroidism, unspecified: Secondary | ICD-10-CM | POA: Diagnosis not present

## 2021-10-15 DIAGNOSIS — G629 Polyneuropathy, unspecified: Secondary | ICD-10-CM | POA: Diagnosis not present

## 2021-10-15 DIAGNOSIS — E559 Vitamin D deficiency, unspecified: Secondary | ICD-10-CM | POA: Diagnosis not present

## 2021-10-15 DIAGNOSIS — M519 Unspecified thoracic, thoracolumbar and lumbosacral intervertebral disc disorder: Secondary | ICD-10-CM | POA: Diagnosis not present

## 2021-10-15 DIAGNOSIS — E782 Mixed hyperlipidemia: Secondary | ICD-10-CM | POA: Diagnosis not present

## 2021-10-15 DIAGNOSIS — E538 Deficiency of other specified B group vitamins: Secondary | ICD-10-CM | POA: Diagnosis not present

## 2021-10-15 DIAGNOSIS — R7303 Prediabetes: Secondary | ICD-10-CM | POA: Diagnosis not present

## 2021-10-15 DIAGNOSIS — Z0000 Encounter for general adult medical examination without abnormal findings: Secondary | ICD-10-CM | POA: Diagnosis not present

## 2022-03-01 DIAGNOSIS — H35363 Drusen (degenerative) of macula, bilateral: Secondary | ICD-10-CM | POA: Diagnosis not present

## 2022-08-23 DIAGNOSIS — Z1231 Encounter for screening mammogram for malignant neoplasm of breast: Secondary | ICD-10-CM | POA: Diagnosis not present

## 2022-10-20 DIAGNOSIS — E039 Hypothyroidism, unspecified: Secondary | ICD-10-CM | POA: Diagnosis not present

## 2022-10-20 DIAGNOSIS — R7303 Prediabetes: Secondary | ICD-10-CM | POA: Diagnosis not present

## 2022-10-20 DIAGNOSIS — E559 Vitamin D deficiency, unspecified: Secondary | ICD-10-CM | POA: Diagnosis not present

## 2022-10-20 DIAGNOSIS — Z0000 Encounter for general adult medical examination without abnormal findings: Secondary | ICD-10-CM | POA: Diagnosis not present

## 2022-10-20 DIAGNOSIS — E782 Mixed hyperlipidemia: Secondary | ICD-10-CM | POA: Diagnosis not present

## 2022-10-20 DIAGNOSIS — E538 Deficiency of other specified B group vitamins: Secondary | ICD-10-CM | POA: Diagnosis not present

## 2022-10-20 DIAGNOSIS — G629 Polyneuropathy, unspecified: Secondary | ICD-10-CM | POA: Diagnosis not present

## 2022-10-20 DIAGNOSIS — Z23 Encounter for immunization: Secondary | ICD-10-CM | POA: Diagnosis not present

## 2022-10-20 DIAGNOSIS — M8580 Other specified disorders of bone density and structure, unspecified site: Secondary | ICD-10-CM | POA: Diagnosis not present

## 2022-11-21 DIAGNOSIS — E349 Endocrine disorder, unspecified: Secondary | ICD-10-CM | POA: Diagnosis not present

## 2023-05-10 DIAGNOSIS — D692 Other nonthrombocytopenic purpura: Secondary | ICD-10-CM | POA: Diagnosis not present

## 2023-05-10 DIAGNOSIS — L821 Other seborrheic keratosis: Secondary | ICD-10-CM | POA: Diagnosis not present

## 2023-08-29 DIAGNOSIS — Z1231 Encounter for screening mammogram for malignant neoplasm of breast: Secondary | ICD-10-CM | POA: Diagnosis not present

## 2023-09-13 DIAGNOSIS — D3131 Benign neoplasm of right choroid: Secondary | ICD-10-CM | POA: Diagnosis not present

## 2023-10-31 DIAGNOSIS — Z961 Presence of intraocular lens: Secondary | ICD-10-CM | POA: Diagnosis not present

## 2023-10-31 DIAGNOSIS — H26491 Other secondary cataract, right eye: Secondary | ICD-10-CM | POA: Diagnosis not present

## 2023-10-31 DIAGNOSIS — H35371 Puckering of macula, right eye: Secondary | ICD-10-CM | POA: Diagnosis not present

## 2023-11-07 DIAGNOSIS — Z961 Presence of intraocular lens: Secondary | ICD-10-CM | POA: Diagnosis not present

## 2023-11-08 DIAGNOSIS — E559 Vitamin D deficiency, unspecified: Secondary | ICD-10-CM | POA: Diagnosis not present

## 2023-11-08 DIAGNOSIS — Z0000 Encounter for general adult medical examination without abnormal findings: Secondary | ICD-10-CM | POA: Diagnosis not present

## 2023-11-08 DIAGNOSIS — E782 Mixed hyperlipidemia: Secondary | ICD-10-CM | POA: Diagnosis not present

## 2023-11-08 DIAGNOSIS — R7303 Prediabetes: Secondary | ICD-10-CM | POA: Diagnosis not present

## 2023-11-08 DIAGNOSIS — E039 Hypothyroidism, unspecified: Secondary | ICD-10-CM | POA: Diagnosis not present

## 2023-11-08 DIAGNOSIS — E538 Deficiency of other specified B group vitamins: Secondary | ICD-10-CM | POA: Diagnosis not present

## 2023-11-08 DIAGNOSIS — G629 Polyneuropathy, unspecified: Secondary | ICD-10-CM | POA: Diagnosis not present

## 2023-11-08 DIAGNOSIS — M8580 Other specified disorders of bone density and structure, unspecified site: Secondary | ICD-10-CM | POA: Diagnosis not present

## 2023-11-08 DIAGNOSIS — Z23 Encounter for immunization: Secondary | ICD-10-CM | POA: Diagnosis not present

## 2023-11-08 DIAGNOSIS — M519 Unspecified thoracic, thoracolumbar and lumbosacral intervertebral disc disorder: Secondary | ICD-10-CM | POA: Diagnosis not present
